# Patient Record
Sex: Male | Born: 1955 | Race: Black or African American | Hispanic: No | Smoking: Never smoker
Health system: Southern US, Community
[De-identification: ages and names within clinical notes are randomized; demographics above are authoritative.]

## PROBLEM LIST (undated history)

## (undated) DIAGNOSIS — N400 Enlarged prostate without lower urinary tract symptoms: Secondary | ICD-10-CM

## (undated) DIAGNOSIS — B009 Herpesviral infection, unspecified: Secondary | ICD-10-CM

## (undated) HISTORY — DX: Herpesviral infection, unspecified: B00.9

## (undated) HISTORY — PX: PLANTAR FASCIA RELEASE: SHX2239

## (undated) HISTORY — PX: CARPAL TUNNEL RELEASE: SHX101

## (undated) HISTORY — PX: ANKLE SURGERY: SHX546

## (undated) HISTORY — PX: OTHER SURGICAL HISTORY: SHX169

## (undated) HISTORY — DX: Benign prostatic hyperplasia without lower urinary tract symptoms: N40.0

## (undated) HISTORY — PX: KNEE SURGERY: SHX244

---

## 2006-11-04 HISTORY — PX: BACK SURGERY: SHX140

## 2007-04-01 ENCOUNTER — Inpatient Hospital Stay (HOSPITAL_COMMUNITY): Admission: RE | Admit: 2007-04-01 | Discharge: 2007-04-06 | Payer: Self-pay | Admitting: Neurosurgery

## 2007-08-14 ENCOUNTER — Encounter: Payer: Self-pay | Admitting: Family Medicine

## 2007-08-17 LAB — HM COLONOSCOPY

## 2007-12-06 HISTORY — PX: ELBOW SURGERY: SHX618

## 2008-06-16 ENCOUNTER — Ambulatory Visit: Payer: Self-pay | Admitting: Family Medicine

## 2008-06-16 DIAGNOSIS — R350 Frequency of micturition: Secondary | ICD-10-CM

## 2008-06-16 DIAGNOSIS — R0989 Other specified symptoms and signs involving the circulatory and respiratory systems: Secondary | ICD-10-CM

## 2008-06-16 DIAGNOSIS — R0609 Other forms of dyspnea: Secondary | ICD-10-CM

## 2008-06-16 LAB — CONVERTED CEMR LAB
Blood in Urine, dipstick: NEGATIVE
Protein, U semiquant: 30
Specific Gravity, Urine: >=1.03
Urobilinogen, UA: 1
pH: 5.5

## 2008-07-21 ENCOUNTER — Telehealth: Payer: Self-pay | Admitting: Family Medicine

## 2008-09-14 ENCOUNTER — Encounter: Payer: Self-pay | Admitting: Family Medicine

## 2009-10-16 ENCOUNTER — Ambulatory Visit: Payer: Self-pay | Admitting: Family Medicine

## 2009-10-16 DIAGNOSIS — R51 Headache: Secondary | ICD-10-CM

## 2009-10-16 DIAGNOSIS — R519 Headache, unspecified: Secondary | ICD-10-CM | POA: Insufficient documentation

## 2009-10-17 ENCOUNTER — Telehealth: Payer: Self-pay | Admitting: Family Medicine

## 2009-10-17 LAB — CONVERTED CEMR LAB
ALT: 26 units/L (ref 0–53)
AST: 21 units/L (ref 0–37)
Alkaline Phosphatase: 75 units/L (ref 39–117)
Creatinine, Ser: 1.35 mg/dL (ref 0.40–1.50)
HCT: 48.9 % (ref 39.0–52.0)
MCHC: 33.1 g/dL (ref 30.0–36.0)
MCV: 81.9 fL (ref 78.0–100.0)
Platelets: 197 10*3/uL (ref 150–400)
RDW: 14.2 % (ref 11.5–15.5)
Sodium: 143 meq/L (ref 135–145)
TSH: 2.841 microintl units/mL (ref 0.350–4.500)
Total Bilirubin: 0.8 mg/dL (ref 0.3–1.2)
Total Protein: 7.4 g/dL (ref 6.0–8.3)
WBC: 7.7 10*3/uL (ref 4.0–10.5)

## 2009-10-18 ENCOUNTER — Telehealth: Payer: Self-pay | Admitting: Family Medicine

## 2010-01-11 ENCOUNTER — Ambulatory Visit: Payer: Self-pay | Admitting: Family Medicine

## 2010-01-11 DIAGNOSIS — M51369 Other intervertebral disc degeneration, lumbar region without mention of lumbar back pain or lower extremity pain: Secondary | ICD-10-CM | POA: Insufficient documentation

## 2010-01-11 DIAGNOSIS — N400 Enlarged prostate without lower urinary tract symptoms: Secondary | ICD-10-CM

## 2010-01-11 DIAGNOSIS — M5136 Other intervertebral disc degeneration, lumbar region: Secondary | ICD-10-CM

## 2010-01-11 HISTORY — DX: Benign prostatic hyperplasia without lower urinary tract symptoms: N40.0

## 2010-01-12 LAB — CONVERTED CEMR LAB
ALT: 23 units/L (ref 0–53)
CO2: 21 meq/L (ref 19–32)
Calcium: 9.4 mg/dL (ref 8.4–10.5)
Chloride: 107 meq/L (ref 96–112)
Cholesterol: 172 mg/dL (ref 0–200)
Creatinine, Ser: 1.2 mg/dL (ref 0.40–1.50)
Glucose, Bld: 109 mg/dL — ABNORMAL HIGH (ref 70–99)
PSA: 0.29 ng/mL (ref 0.10–4.00)
Total Bilirubin: 0.7 mg/dL (ref 0.3–1.2)
Total CHOL/HDL Ratio: 5.5
Triglycerides: 126 mg/dL (ref <150)
VLDL: 25 mg/dL (ref 0–40)

## 2010-01-22 ENCOUNTER — Encounter: Payer: Self-pay | Admitting: Family Medicine

## 2010-01-26 ENCOUNTER — Telehealth: Payer: Self-pay | Admitting: Family Medicine

## 2010-01-31 ENCOUNTER — Encounter (INDEPENDENT_AMBULATORY_CARE_PROVIDER_SITE_OTHER): Payer: Self-pay | Admitting: *Deleted

## 2010-06-11 ENCOUNTER — Encounter: Payer: Self-pay | Admitting: Family Medicine

## 2010-09-18 ENCOUNTER — Telehealth: Payer: Self-pay | Admitting: Family Medicine

## 2010-12-05 NOTE — Assessment & Plan Note (Signed)
Summary: CPE AND LABS   Vital Signs:  Patient profile:   55 year old male Height:      73.5 inches Weight:      254 pounds Pulse rate:   75 / minute BP sitting:   139 / 81  (left arm) Cuff size:   large  Vitals Entered By: Kathlene November (January 11, 2010 1:56 PM) CC: CPE and labs Is Patient Diabetic? No   Primary Care Provider:  Nani Gasser MD  CC:  CPE and labs.  History of Present Illness: Right ear feels constantly itching.  Intially the avodart was working but now feels like it is not working.  Now the frequent urination is waking him up. Will void 4-5 times within a few hours.    Mid back tightness and soreness. Started a couple of months ago.  STarted his weight lifting program around that time. Now has been doing some stretches before his workout but this really hasn't helped.  Feels it s not coming from his back/spine.  Hands have been more painful and stiff. Comes and goes. Not time relationship to AM or daytime. Feels he has good flexibilty.      Current Medications (verified): 1)  Avodart 0.5 Mg  Caps (Dutasteride) .... Take 1 Tablet By Mouth Once A Day 2)  Hydrocortisone-Acetic Acid 1-2 % Soln (Hydrocortisone-Acetic Acid) .... 4 Gtt in Affected Ear Two Times A Day For 1 Week.  Allergies (verified): No Known Drug Allergies  Comments:  Nurse/Medical Assistant: The patient's medications and allergies were reviewed with the patient and were updated in the Medication and Allergy Lists. Kathlene November (January 11, 2010 1:57 PM)  Past History:  Past Surgical History: Last updated: 06/16/2008 Right rotator cuffsurgery.  Back surgery 2008 x 2 Elbow srugery 12/2007  Family History: Last updated: 10/16/2009 Mother with alchoholism GM with Cancer.  Father with DM, stroke, back problems, thyroid problems.   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   Married to [employer] with 4 kids.  Never Smoked Alcohol use-no Drug  use-no Regular exercise-yes  Physical Exam  General:  Well-developed,well-nourished,in no acute distress; alert,appropriate and cooperative throughout examination Head:  Normocephalic and atraumatic without obvious abnormalities. No apparent alopecia or balding. Eyes:  No corneal or conjunctival inflammation noted. EOMI. Perrla.  Ears:  External ear exam shows no significant lesions or deformities.  Otoscopic examination reveals clear canals, tympanic membranes are intact bilaterally without bulging, retraction, inflammation or discharge. Hearing is grossly normal bilaterally. Nose:  External nasal examination shows no deformity or inflammation. Nasal mucosa are pink and moist without lesions or exudates. Mouth:  Oral mucosa and oropharynx without lesions or exudates.  Teeth in good repair. Neck:  No deformities, masses, or tenderness noted. Chest Wall:  No deformities, masses, tenderness or gynecomastia noted. Lungs:  Normal respiratory effort, chest expands symmetrically. Lungs are clear to auscultation, no crackles or wheezes. Heart:  Normal rate and regular rhythm. S1 and S2 normal without gallop, murmur, click, rub or other extra sounds. Abdomen:  Bowel sounds positive,abdomen soft and non-tender without masses, organomegaly or hernias noted. Rectal:  No external abnormalities noted. Normal sphincter tone. No rectal masses or tenderness. Prostate:  no nodules, no asymmetry, and 2+ enlarged.   Msk:  No deformity or scoliosis noted of thoracic or lumbar spine.  NROM of the lumbar spine. He he actually has good flexibility of the hamstrings.  Pulses:  R and L carotid,radial,dorsalis pedis and posterior tibial pulses are  full and equal bilaterally Extremities:  No clubbing, cyanosis, edema, or deformity noted with normal full range of motion of all joints.   Neurologic:  No cranial nerve deficits noted. Station and gait are normal.DTRs are symmetrical throughout. Sensory, motor and coordinative  functions appear intact. Skin:  no rashes.   Cervical Nodes:  No lymphadenopathy noted Psych:  Cognition and judgment appear intact. Alert and cooperative with normal attention span and concentration. No apparent delusions, illusions, hallucinations   Impression & Recommendations:  Problem # 1:  HEALTH MAINTENANCE EXAM (ICD-V70.0) Contiue with regular exercise.  Encouraged weight loss. Discussed keeping a dairy for one week fo daily caloric intake and cutting calories but 200 a day to help him lose weight. D Dur for screening labs.  Given Tdap today.  Colonoscopy is up todate.  Still having chronic right ear itching even thought exam is nromal. Says it is driving him crazy so will refer to ENT for further treatment.   Orders: T-Comprehensive Metabolic Panel [id number]) T-Lipid Profile [id number]) T-TSH [id number]) T-PSA ([id number])  Problem # 2:  HYPERTROPHY PROSTATE W/O UR OBST & OTH LUTS (ICD-600.00) Still having sxs on the avodart.  AUA score is 20 which is severe.  QOL score is 5 (unhappy) so recommend adding flomax to the avodart. If sxs not improving in the next 2-3 weeks then call and will refer to Urology.    Problem # 3:  BACK PAIN (ICD-724.5) Given H.O. on back stretches. If not getting better over th next couple of weeks then please let me know.   Complete Medication List: 1)  Avodart 0.5 Mg Caps (Dutasteride) .... Take 1 tablet by mouth once a day 2)  Hydrocortisone-acetic Acid 1-2 % Soln (Hydrocortisone-acetic acid) .... 4 gtt in affected ear two times a day for 1 week. 3)  Flomax 0.4 Mg Caps (Tamsulosin hcl) .... Take 1 tablet by mouth once a day  Other Orders: Tdap => 26yrs IM ([id number]) Admin 1st Vaccine ([id number]) ENT Referral (ENT) Prescriptions: FLOMAX 0.4 MG CAPS (TAMSULOSIN HCL) Take 1 tablet by mouth once a day  #30 x 1   Entered and Authorized by:   Nani Gasser MD   Signed by:   Nani Gasser MD on 01/11/2010   Method used:   [facility]  [facility]        [facility] [phone][facility] (retail)       [address] [place], [place]  [address]       Ph: [phone] or [phone]       Fax: [phone]   RxID:   [id number]   Flex Sig Next Due:  Not Indicated Colonoscopy Result Date:  11/05/2007 Hemoccult Next Due:  Not Indicated   Immunizations Administered:  Tetanus Vaccine:    Vaccine Type: Tdap    Site: left deltoid    Mfr: GlaxoSmithKline    Dose: 0.5 ml    Route: IM    Given by: Kathlene November    Exp. Date: 01/27/2012    Lot #: [id number]    VIS given: 09/22/07 version given January 11, 2010.

## 2010-12-05 NOTE — Letter (Signed)
Summary: AUA Questionnaire/[place] [facility]  AUA Questionnaire/[place] [facility]   Imported By: Lanelle Bal 01/18/2010 12:14:41  _____________________________________________________________________  External Attachment:    Type:   Image     Comment:   External Document

## 2010-12-05 NOTE — Progress Notes (Signed)
Summary: returning call  Phone Note Call from Patient   Summary of Call: Received call from pt yesterday requesting a callback. Returned call this a.m. and left message for pt. to call back.  Mervin Kung CMA  January 26, 2010 9:26 AM   Left message for pt. to return call. Mervin Kung CMA  January 29, 2010 8:48 AM   Follow-up for Phone Call        given med for urinary frequency at last OV and wondering if it has any sexual side effects. Sex drive has decreased Follow-up by: Kathlene November,  January 29, 2010 3:18 PM  Additional Follow-up for Phone Call Additional follow up Details #1::        It is not commmon but it can cause dec libido or abnormal ejaculation. Can stop med and we will refer to Urology for furher treatment options. Please print referrall order.  Additional Follow-up by: Nani Gasser MD,  January 29, 2010 8:39 PM    Additional Follow-up for Phone Call Additional follow up Details #2::    Pt notified. Referral sent. Follow-up by: Kathlene November,  January 30, 2010 8:16 AM

## 2010-12-05 NOTE — Consult Note (Signed)
Summary: [facility]  [facility]   Imported By: Lanelle Bal 02/01/2010 11:06:49  _____________________________________________________________________  External Attachment:    Type:   Image     Comment:   External Document

## 2010-12-05 NOTE — Progress Notes (Signed)
  Phone Note From Pharmacy   [facility]  [place] [phone][facility] Call For: Dr. Linford Arnold  Summary of Call: wants a rx for fluticasone Initial call taken by: Avon Gully CMA, Duncan Dull),  September 18, 2010 2:02 PM    New/Updated Medications: FLUTICASONE PROPIONATE 50 MCG/ACT SUSP (FLUTICASONE PROPIONATE) 1-2 sprays each nostril once a day Prescriptions: FLUTICASONE PROPIONATE 50 MCG/ACT SUSP (FLUTICASONE PROPIONATE) 1-2 sprays each nostril once a day  #30 x 1   Entered by:   Avon Gully CMA, (AAMA)   Authorized by:   Nani Gasser MD   Signed by:   Avon Gully CMA, (AAMA) on 09/18/2010   Method used:   Electronically to        [facility] [phone]* (retail)       [address] [place], [place]  [address]       Ph: [phone] or [phone]       Fax: [phone]   RxID:   [id number]

## 2010-12-05 NOTE — Letter (Signed)
Summary: Primary Care Consult Scheduled Letter  [place] [facility]  [address]   [place], [place] [address]   Phone: [phone]  Fax: [phone]      01/31/2010 MRN: [id number]  [facility] [address] [place], [place]  [address]    Dear Mr. [facility],      We have scheduled an appointment for you.  At the recommendation of Dr.METHENEY, we have scheduled you a consult with [facility] ,[place] , DR Benancio Deeds  on APRIL 20,2011 at 10:17m_.  Their address is_[address], [address], [place] . The office phone number is [phone].  If this appointment day and time is not convenient for you, please feel free to call the office of the doctor you are being referred to at the number listed above and reschedule the appointment.     It is important for you to keep your scheduled appointments. We are here to make sure you are given good patient care. If you have questions or you have made changes to your appointment, please notify us at  [phone], ask for HELEN.    Thank you, HELEN Patient Care Coordinator [place]

## 2011-02-15 ENCOUNTER — Encounter: Payer: Self-pay | Admitting: Family Medicine

## 2011-02-18 ENCOUNTER — Ambulatory Visit (INDEPENDENT_AMBULATORY_CARE_PROVIDER_SITE_OTHER): Payer: BLUE CROSS/BLUE SHIELD | Admitting: Family Medicine

## 2011-02-18 ENCOUNTER — Encounter: Payer: Self-pay | Admitting: Family Medicine

## 2011-02-18 VITALS — BP 132/89 | HR 79 | Ht 72.0 in | Wt 244.0 lb

## 2011-02-18 DIAGNOSIS — Z0000 Encounter for general adult medical examination without abnormal findings: Secondary | ICD-10-CM

## 2011-02-18 DIAGNOSIS — R6882 Decreased libido: Secondary | ICD-10-CM

## 2011-02-18 DIAGNOSIS — N529 Male erectile dysfunction, unspecified: Secondary | ICD-10-CM

## 2011-02-18 DIAGNOSIS — N400 Enlarged prostate without lower urinary tract symptoms: Secondary | ICD-10-CM

## 2011-02-18 MED ORDER — SILDENAFIL CITRATE 100 MG PO TABS: 100.0000 mg | ORAL_TABLET | ORAL | Status: DC | PRN

## 2011-02-18 MED ORDER — DUTASTERIDE 0.5 MG PO CAPS: 0.5000 mg | ORAL_CAPSULE | Freq: Every day | ORAL | Status: DC

## 2011-02-18 NOTE — Progress Notes (Signed)
  Subjective:    Patient ID: Steve Maxwell, male    DOB: May 04, 1956, 55 y.o.   MRN: [id number]  HPI Here for CPE today. Has questions about his dec libido. He saw Urology last year and tried several meds for his prostate that he felt was causing ED. They also tested his testosterone that was low and he was then referred to endocrinology. He was taking a work out supplement and they asked him to stop it and then recheck his levels and they were normal. He is now off off the BPH meds and still having libido problems. He did try cialis but says it made him sick for almost 3 days. Hasn't tried any of the other meds.  He is still having frequent urination at night that is disturbing his sleep.    Review of Systems     Objective:   Physical Exam  Constitutional: He is oriented to person, place, and time. He appears well-developed and well-nourished.  HENT:  Head: Normocephalic and atraumatic.  Right Ear: External ear normal.  Left Ear: External ear normal.  Nose: Nose normal.  Mouth/Throat: Oropharynx is clear and moist.  Eyes: Conjunctivae and EOM are normal. Pupils are equal, round, and reactive to light.  Neck: Normal range of motion. Neck supple.  Cardiovascular: Normal rate, regular rhythm, normal heart sounds and intact distal pulses.        No carotid or abdominal bruits.   Pulmonary/Chest: Effort normal and breath sounds normal.  Abdominal: Soft. Bowel sounds are normal.  Genitourinary: Rectum normal. Guaiac negative stool.  Musculoskeletal: Normal range of motion. He exhibits no edema.       Muscular build.   Neurological: He is alert and oriented to person, place, and time. He has normal reflexes.  Skin: Skin is warm.  Psychiatric: He has a normal mood and affect.          Assessment & Plan:  CPE- Normal exam today except for enlarge prostate. I do thin he needs to work on his weight. He really needs to lose about 20 lbs.  He works out regularly  ED - Trial of viagra.  Didn't do well with cialis

## 2011-02-18 NOTE — Assessment & Plan Note (Signed)
Restart Advodart.  AUA sx score of 11, but rates his QOL of 5 (unhappy). Follow up in 6 wks and repeat AUA score.  ED score of 11 today.  Test questionnaire + for 3 questions.

## 2011-02-18 NOTE — Patient Instructions (Signed)
Your goal weight is under 190.  We will call you with your lab results once you go to the lab fasting ( 8 hours).

## 2011-02-18 NOTE — Assessment & Plan Note (Signed)
ED score of 11 today.  Test questionnaire + for 3 questions.  .  Discussed that there are many factors that can contribute to Ed including rest, diet, weight, and relationship with his wife.

## 2011-02-19 LAB — LIPID PANEL
Cholesterol: 150 mg/dL (ref 0–200)
HDL: 30 mg/dL — ABNORMAL LOW (ref >39)
LDL Cholesterol: 97 mg/dL (ref 0–99)
Total CHOL/HDL Ratio: 5 Ratio
Triglycerides: 115 mg/dL (ref <150)
VLDL: 23 mg/dL (ref 0–40)

## 2011-02-19 LAB — PSA: PSA: 0.46 ng/mL (ref <=4.00)

## 2011-02-20 ENCOUNTER — Telehealth: Payer: Self-pay | Admitting: Family Medicine

## 2011-02-20 LAB — TESTOSTERONE, FREE, TOTAL, SHBG
Sex Hormone Binding: 26 nmol/L (ref 13–71)
Testosterone, Free: 56.5 pg/mL (ref 47.0–244.0)
Testosterone-% Free: 2.2 % (ref 1.6–2.9)
Testosterone: 255.37 ng/dL (ref 250–890)

## 2011-02-20 LAB — COMPLETE METABOLIC PANEL WITH GFR
ALT: 23 U/L (ref 0–53)
CO2: 28 mEq/L (ref 19–32)
Calcium: 9.5 mg/dL (ref 8.4–10.5)
Chloride: 109 mEq/L (ref 96–112)
Creat: 1.46 mg/dL (ref 0.40–1.50)
GFR, Est African American: 60 mL/min — ABNORMAL LOW (ref >60)
GFR, Est Non African American: 50 mL/min — ABNORMAL LOW (ref >60)
Glucose, Bld: 127 mg/dL — ABNORMAL HIGH (ref 70–99)
Sodium: 144 mEq/L (ref 135–145)
Total Protein: 6.6 g/dL (ref 6.0–8.3)

## 2011-02-20 NOTE — Telephone Encounter (Signed)
Call pt: testosterone on the low end of normal. If he would like to try tesosteronet replacement we can.  His kidney function is up a little. Is he taking any muscle building products. If so then stop these and lets recheck the kidney funciton in one month. Sugar in the diabetic range. Make app tiwht me din one month to check and make sure he doesn't have Diabetes. Cholesterol is OK except the good cholesterol is low. Aerobic exercise will improve this. Prostate funtiton is up a little from last time but still in the normal range. Recheck in 6 months.

## 2011-02-21 NOTE — Telephone Encounter (Signed)
Called and left message to call back in re labs 

## 2011-02-21 NOTE — Telephone Encounter (Signed)
Pt notified and will f/u in one month

## 2011-02-28 ENCOUNTER — Telehealth: Payer: Self-pay | Admitting: *Deleted

## 2011-02-28 NOTE — Telephone Encounter (Signed)
[facility] called to check status of prior auth for pt's Avodart.  Form was faxed on 4/24 and 4/26.  [employer] will need to be called for PA.  800 # not given by Myline as she states it is on the paperwork she has faxed.

## 2011-03-01 ENCOUNTER — Telehealth: Payer: Self-pay | Admitting: *Deleted

## 2011-03-01 MED ORDER — VALACYCLOVIR HCL 1 G PO TABS: 2000.0000 mg | ORAL_TABLET | Freq: Two times a day (BID) | ORAL | Status: DC

## 2011-03-01 MED ORDER — TESTOSTERONE 20.25 MG/ACT (1.62%) TD GEL: 2.0000 | Freq: Every day | TRANSDERMAL | Status: DC

## 2011-03-01 NOTE — Telephone Encounter (Signed)
Will send over the gel. Can go online to androgel and print out a coupon.  Also sent over med for fever blisters. If wants can take OTC niacin and that will help.

## 2011-03-01 NOTE — Telephone Encounter (Signed)
Pt.notified

## 2011-03-01 NOTE — Telephone Encounter (Signed)
Pt wants to try testos therapy and wants to do the gel under arms. Pt also wants a rx sent to his pharm for fever blisters.Pt also wants to know what else he can do for his chol as far excersise if he already spends an hour on the treadmill.

## 2011-03-19 NOTE — Op Note (Signed)
Steve Maxwell, Maxwell NO.:  [id number]   MEDICAL RECORD NO.:  [id number]          PATIENT TYPE:  INP   LOCATION:  3008                         FACILITY:  [facility]   PHYSICIAN:  Cristi Loron, M.D.DATE OF BIRTH:  November 28, 1955   DATE OF PROCEDURE:  04/01/2007  DATE OF DISCHARGE:                               OPERATIVE REPORT   BRIEF HISTORY:  The patient is a 55 year old black male who has suffered  from back and left greater than right leg pain.  He failed medical  management and was worked up with a lumbar MRI which demonstrated the  patient had degenerative disc disease and foraminal stenosis at L5-S1.  I discussed the various treatment options with the patient including  surgery.  The patient weighed the risks, benefits and alternatives of  surgery and decided to proceed with a L5-S1 decompression and fusion.   PREOPERATIVE DIAGNOSIS:  L5-S1 degenerative disc disease, spondylosis,  spinal stenosis, lumbar radiculopathy, lumbago.   POSTOPERATIVE DIAGNOSIS:  L5-S1 degenerative disc disease, spondylosis, spinal stenosis, lumbar  radiculopathy, lumbago.   PROCEDURE:  Bilateral laminotomies and foraminotomies at L5; L5-S1  posterior lumbar interbody fusion; insertion of L5-S1 interbody  prosthesis (Capstone PEEK cages), L5-S1 posterior nonsegmental  instrumentation with Legacy titanium pedicle screws and rods; L5-S1  posterolateral arthrodesis with local morselized autograft bone and  VITOSS bone graft extender.   SURGEON:  Cristi Loron, M.D.   ASSISTANT:  Danae Orleans. Venetia Maxon, M.D.   ANESTHESIA:  General endotracheal anesthesia.   ESTIMATED BLOOD LOSS:  300 mL.   SPECIMENS:  None.   DRAINS:  None.   COMPLICATIONS:  None.   DESCRIPTION OF PROCEDURE:  The patient was brought to the operating room  by the anesthesia team and general endotracheal anesthesia was induced.  The patient was turned to the prone position on the Wilson frame.  His  lumbosacral region was then prepared with Betadine scrub and Betadine  solution.  Sterile drapes were applied.  I then injected the area to be  incised with Marcaine with epinephrine solution.  I used the scalpel to  make a linear midline incision over the L5-S1 interspace. I used  electrocautery to perform a bilateral subperiosteal dissection exposing  the spinous process and lamina of L5 and the upper sacrum.  We obtained  an intraoperative radiograph to confirm our location.  We then inserted  the Versatrac retractor for exposure.   We began on the left side. We used a high speed drill to perform a L5  laminotomy.  Because the patient had foraminal stenosis, we did a  greater decompression than was required to perform the lumbar interbody  fusion, i.e., we performed foraminotomies about the left L5 and S1 nerve  roots.  In order to get decompression of the exiting left L5 nerve root,  i.e., the symptomatic side, I removed the inferior facet of L5 and we  performed a wide foraminotomy about the left L5 nerve root.  We then  repeated this procedure in analogous fashion on the right side, although  we did not need to remove the inferior facet of L5,  but we did perform a  generous foraminotomy about the right L L5-S1 nerve root, as well.   Having completed the decompression, we now turned attention to the  posterior lumbar fusion.  We incised the L5-S1 intravertebral disc  bilaterally and performed aggressive bilateral discectomy using  pituitary forceps and the Epstein and Scoville curets.  We then prepared  the vertebral endplates for the fusion using the curets, removing all  the soft tissue.  We then used trial spacers and determined to use a 10  by 26 mm Capstone PEEK cage bilaterally.  We prefilled these cages with  local morselized autograft bone we obtained during the decompression as  well as VITOSS bone graft extender.  We filled lateral and medial to the  disc space, as well,  with VITOSS and local bone.  We inserted the  prosthesis into the interspace, of course after retracting the nerve  roots out of harm's way, reconfirmed the position using fluoroscopy.   Having completed the posterior lumbar interbody fusion and placement of  the interbody prosthesis, we now turned attention to the  instrumentation. Under fluoroscopic guidance, we cannulated the  bilateral L5 and S1 pedicles using a bone probe.  We tapped the L5  pedicles with a 5.5, the S1 pedicles with a 6.5 mm tap.  We inserted a  6.5 by 50 mm pedicle screws bilaterally at L5 and 7.5 x 35 and 40 mm  pedicle screws at S1, again under fluoroscopic guidance.  We then  palpated along the medial aspect of the L5-S1 nerve roots and noted  there was no cortical breeches and the L5-S1 nerve roots were not  injured.  On AP fluoroscopy, the right L5 pedicle screw looked like it  might have been somewhat lateral.  We removed the screw and then probed  inside the screw path and noted that the path was well within the bone,  we replaced a screw and we then connected the unilateral pedicle screws  with a lordotic rod we fastened in place with the caps which we  tightened appropriately completing the instrumentation.   We then turned attention to the posterolateral arthrodesis.  We  decorticated the remainder of the right pars, lamina and the L5-S1 facet  as well as the transverse processes on the left at L5-S1, the sacral ala  and S1.  We then placed a combination of the VITOSS and local bone over  these decorticated posterolateral structures completing the  posterolateral arthrodesis.   We then irrigated the wound out with bacitracin solution.  We inspected  the dura and the L5and S1 nerve roots bilaterally noting that they were  well decompressed.  We obtained hemostasis using bipolar electrocautery.  We then removed the retractor and then reapproximated the patient's thoracolumbar fascia with interrupted #1  Vicryl suture, subcutaneous  tissues with interrupted 2-0 Vicryl suture, and the skin with Steri-  Strips and Benzoin.  The wound was then coated with bacitracin ointment,  a sterile dressing was applied, the drapes were removed.  The patient  was subsequently returned to the supine position where he was extubated  by the anesthesia team and transported to the post anesthesia care unit  in stable condition.  All sponge, instrument and needle counts were  correct at the end of the case.      Cristi Loron, M.D.  Electronically Signed     JDJ/MEDQ  D:  04/01/2007  T:  04/01/2007  Job:  [id number]

## 2011-03-20 ENCOUNTER — Encounter: Payer: Self-pay | Admitting: Family Medicine

## 2011-03-20 ENCOUNTER — Telehealth: Payer: Self-pay | Admitting: Family Medicine

## 2011-03-20 DIAGNOSIS — E291 Testicular hypofunction: Secondary | ICD-10-CM | POA: Insufficient documentation

## 2011-03-20 NOTE — Telephone Encounter (Signed)
Called for prior auth on this patient for his androgel 1.62% gel pump.  Rec prior auth and confirmation will be sent to our office and the pharm today.  [facility] inform[facility]  Also told pharmacist that pt belongs to a non brand name plan with his insurance therefore the only drug on list that is generic is finasteride and I'll have to get an approval on that medication from Dr. Linford Arnold before calling script.  Insurance will not approve the avodart since pt participates in non brand name program with insurance. Plan:  Routed to Dr. Marlyne Beards, LPN Dom[facility] Dimes

## 2011-03-21 MED ORDER — FINASTERIDE 5 MG PO TABS: 5.0000 mg | ORAL_TABLET | Freq: Every day | ORAL | Status: DC

## 2011-03-21 NOTE — Telephone Encounter (Signed)
OK, sent over [email or web address] Thank you for working on this.

## 2011-03-22 NOTE — Discharge Summary (Signed)
NAMEGUNNARD, Steve Maxwell NO.:  [id number]   MEDICAL RECORD NO.:  [id number]          PATIENT TYPE:  INP   LOCATION:  3008                         FACILITY:  [facility]   PHYSICIAN:  Cristi Loron, M.D.DATE OF BIRTH:  Oct 25, 1956   DATE OF ADMISSION:  04/01/2007  DATE OF DISCHARGE:  04/06/2007                               DISCHARGE SUMMARY   For further details of this admission, please refer to typed history and  physical.   BRIEF HISTORY:  The patient is a 55 year old black male who has suffered  from back and left greater than right leg pain.  He has failed medical  management, was worked up with a lumbar MRI, which demonstrated that the  patient had degenerative disk disease and a foraminal stenosis at L5-S1.  I discussed the various treatment options with the patient, including  surgery.  The patient has weighed the risks, benefits, and alternatives  of surgery and has decided to proceed with an L5-S1 decompression and  fusion.   For further details of this admission, please refer to typed history and  physical.   HOSPITAL COURSE:  I admitted the patient at the [facility] on  Apr 01, 2007.  On admission, I performed an L5 decompression,  instrumentation, and fusion.  The surgery went well.  For full details  of this operation, please refer to the typed operative note.   POSTOPERATIVE COURSE:  The patient's postoperative course was  unremarkable except that he had urinary retention.  This persisted.  The  patient was seen by the urologist, and the urinary retention resolved by  April 06, 2007.   At this point the patient was requesting to be discharged home and was  discharged home on April 06, 2007.   DISCHARGE INSTRUCTIONS:  The patient is instructed to follow up with me  in a month.  He was instructed to follow up with the urologist as per  his instructions.   FINAL DIAGNOSIS:  L5-S1 degenerative disease, spondylosis, spinal  stenosis, lumbar  radiculopathy, lumbago.   PROCEDURES PERFORMED:  1. Bilateral laminotomies and foraminotomies of L5.  2. L5-S1 posterior lumbar interbody fusion.  3. Insertion at L5-S1 interbody prosthesis (PEEK cages).  4. L5-S1 posterior nonsegmental instrumentation with Legacy titanium      pedicle screws and rods.  5. L5-S1 posterolateral arthrodesis with local morselized autograft      bone and VITOSS bone graft extender.      Cristi Loron, M.D.  Electronically Signed     JDJ/MEDQ  D:  04/30/2007  T:  05/01/2007  Job:  [id number]

## 2011-05-10 ENCOUNTER — Telehealth: Payer: Self-pay | Admitting: Family Medicine

## 2011-05-10 NOTE — Telephone Encounter (Signed)
Pt called asking questions about taking protein shakes and said the provider had told him to stop, but he didn't think he was over doing it. Plan:  When reviewing last office note it made mention on ED and enlarged prostrate and he was told to lay off the work out supplements.  Told pt to call back Monday and sched a 6 week follow up from April 2012 which he had not done and he could ask add'l questions to the provider about the protein shakes. Steve Newcomer, LPN [facility]

## 2011-06-07 ENCOUNTER — Telehealth: Payer: Self-pay | Admitting: Family Medicine

## 2011-06-07 MED ORDER — IBUPROFEN 800 MG PO TABS: 800.0000 mg | ORAL_TABLET | Freq: Three times a day (TID) | ORAL | Status: DC | PRN

## 2011-06-07 NOTE — Telephone Encounter (Signed)
rx sent

## 2011-06-07 NOTE — Telephone Encounter (Signed)
Pt called and stated he had back surgery 4 yrs ago, and obviously he is having pain and complaining of pain and soreness.  Rates pain #9/10.  Pt is requesting a prescription for ibuprofen 800 mg for this.  States this works well for him when he has these flares. Please advise. Jarvis Newcomer, LPN [facility]

## 2011-06-07 NOTE — Telephone Encounter (Signed)
Pt notified and had to [facility] that his ibuprofen script had been sent to his pharm. Jarvis Newcomer, LPN [facility]

## 2011-07-05 ENCOUNTER — Encounter: Payer: Self-pay | Admitting: Family Medicine

## 2011-07-05 ENCOUNTER — Ambulatory Visit (INDEPENDENT_AMBULATORY_CARE_PROVIDER_SITE_OTHER): Payer: BLUE CROSS/BLUE SHIELD | Admitting: Family Medicine

## 2011-07-05 DIAGNOSIS — N400 Enlarged prostate without lower urinary tract symptoms: Secondary | ICD-10-CM

## 2011-07-05 DIAGNOSIS — M542 Cervicalgia: Secondary | ICD-10-CM

## 2011-07-05 DIAGNOSIS — E291 Testicular hypofunction: Secondary | ICD-10-CM

## 2011-07-05 MED ORDER — DUTASTERIDE 0.5 MG PO CAPS: 0.5000 mg | ORAL_CAPSULE | Freq: Every day | ORAL | Status: DC

## 2011-07-05 NOTE — Progress Notes (Signed)
  Subjective:    Patient ID: Steve Maxwell, male    DOB: July 23, 1956, 55 y.o.   MRN: [id number]  HPI BPH - Worse on the finasteride. Felt the avodart worked much beter. He was able to sleep through the night on the vaodart but now having to get up about 4-5 times at night to urinate.  He would like to go back to Avodart if possible. It was initially changed to finasteride because of his insurance plan.  Having pain around his shoulders and neck. Even bought a new pillow. Says has a lot of tension.  Feels really sore.  Hasn't been working out in [employer].  No trauma. He has been very stressed.   No numbness or tingling in the hands. Being at work seems to make it worse. He does note that he has not been exercising recently. He has been under a lot more stress recently even outside of work.  Hypogonadism-he never filled the prescription for the AndroGel because it was going to be so expensive. He wanted to talk further about the injections.  Review of Systems     Objective:   Physical Exam  Constitutional: He is oriented to person, place, and time. He appears well-developed and well-nourished.  Cardiovascular: Normal rate, regular rhythm and normal heart sounds.   Pulmonary/Chest: Effort normal and breath sounds normal.  Musculoskeletal:       Neck with normal flexion, extension, rotation right and left. Pain with side bending to the RT, better to the LT. Non tender over the cerivicl spine or the paraspinous muscles. Shoulders with NROM.   Neurological: He is alert and oriented to person, place, and time.  Skin: Skin is warm.  Psychiatric: He has a normal mood and affect. His behavior is normal.          Assessment & Plan:  Acute neck pain - LIkley more msk.  Recommend gentle stretches. Work on [employer] and tension. If not better can get an cervical xray to eval for arthritis. Consider physical therapy as well. Also work on tension at work.  BPH - discussed will change back to avodart and  should hopefully now be covered with his insurance.   Hypogonadism-discussed with him the option of starting the testosterone injections. We will also see if we have a coupon carpeting in her shell that might make it more for that. He will call Monday no heat sides. I explained that sometimes treating the testosterone can help with mood, energy level, muscle strength. That certainly we do not have to treat it. He also has complaints of low libido and testosterone replacement therapy could help with this.

## 2011-07-05 NOTE — Patient Instructions (Signed)
Can look online for coupon for the androgel too! If not consider the injections.

## 2011-08-13 ENCOUNTER — Telehealth: Payer: Self-pay | Admitting: *Deleted

## 2011-08-13 DIAGNOSIS — R351 Nocturia: Secondary | ICD-10-CM

## 2011-08-13 NOTE — Telephone Encounter (Signed)
Left message on vm

## 2011-08-13 NOTE — Telephone Encounter (Signed)
Pt called and states the avodart is no longer working for him and he has to get up 4-5 times during the night. Pt also states he want to start back working out and wants to know what type of protein shakes would be best

## 2011-08-13 NOTE — Telephone Encounter (Signed)
If Avodart not working as well then lets check UA and recheck a PSA (last was in April).  I don't really have any suggestions for protein shakes.  Just make sure don't take more than the can recommends.

## 2011-10-03 ENCOUNTER — Other Ambulatory Visit: Payer: Self-pay | Admitting: *Deleted

## 2011-10-03 MED ORDER — DUTASTERIDE 0.5 MG PO CAPS: 0.5000 mg | ORAL_CAPSULE | Freq: Every day | ORAL | Status: DC

## 2011-10-04 ENCOUNTER — Other Ambulatory Visit: Payer: Self-pay | Admitting: *Deleted

## 2011-10-04 MED ORDER — DUTASTERIDE 0.5 MG PO CAPS: 0.5000 mg | ORAL_CAPSULE | Freq: Every day | ORAL | Status: DC

## 2011-10-07 ENCOUNTER — Other Ambulatory Visit: Payer: Self-pay | Admitting: *Deleted

## 2011-10-15 ENCOUNTER — Other Ambulatory Visit: Payer: Self-pay | Admitting: *Deleted

## 2011-10-15 MED ORDER — DUTASTERIDE 0.5 MG PO CAPS: 0.5000 mg | ORAL_CAPSULE | Freq: Every day | ORAL | Status: DC

## 2011-10-17 ENCOUNTER — Other Ambulatory Visit: Payer: Self-pay | Admitting: *Deleted

## 2011-10-17 MED ORDER — VALACYCLOVIR HCL 1 G PO TABS: 2000.0000 mg | ORAL_TABLET | Freq: Two times a day (BID) | ORAL | Status: DC

## 2011-12-12 ENCOUNTER — Encounter: Payer: Self-pay | Admitting: Family Medicine

## 2011-12-12 ENCOUNTER — Ambulatory Visit (INDEPENDENT_AMBULATORY_CARE_PROVIDER_SITE_OTHER): Payer: 59 | Admitting: Family Medicine

## 2011-12-12 VITALS — BP 138/95 | HR 76 | Wt 251.0 lb

## 2011-12-12 DIAGNOSIS — E291 Testicular hypofunction: Secondary | ICD-10-CM

## 2011-12-12 DIAGNOSIS — N400 Enlarged prostate without lower urinary tract symptoms: Secondary | ICD-10-CM

## 2011-12-12 DIAGNOSIS — E669 Obesity, unspecified: Secondary | ICD-10-CM | POA: Insufficient documentation

## 2011-12-12 DIAGNOSIS — N529 Male erectile dysfunction, unspecified: Secondary | ICD-10-CM

## 2011-12-12 MED ORDER — TESTOSTERONE 20.25 MG/ACT (1.62%) TD GEL: 2.0000 | Freq: Every day | TRANSDERMAL | Status: DC

## 2011-12-12 MED ORDER — DOXAZOSIN MESYLATE 2 MG PO TABS: 2.0000 mg | ORAL_TABLET | Freq: Every day | ORAL | Status: DC

## 2011-12-12 MED ORDER — CYCLOBENZAPRINE HCL 10 MG PO TABS: 10.0000 mg | ORAL_TABLET | Freq: Every evening | ORAL | Status: DC | PRN

## 2011-12-12 NOTE — Progress Notes (Signed)
  Subjective:    Patient ID: Steve Maxwell, male    DOB: 07-11-1956, 56 y.o.   MRN: [id number]  HPI BPH- Avodart is doing well but still sometimes have more sxs. Thought forgot to take it today and has had to urinate 10 times today already.  Obesity-he is very frustrated because he's been working out sometimes even twice a day without any good results. He still having difficulty losing the weight and says he eats very healthy. He recently started increase the protein in his diet about a week ago.  ED - Headaches on viagra and cialis.  He is now seperated from his wife for the lat 5-6 months.    Neck pain-he is still having a lot of neck pain. He is not interested in physical therapy at this point. He says he has tried muscle relaxers in the past and didn't find them helpful and would like to try this again. His pain is mostly in the upper trapezius area.  Review of Systems     Objective:   Physical Exam  Constitutional: He is oriented to person, place, and time. He appears well-developed and well-nourished.  HENT:  Head: Normocephalic and atraumatic.  Neurological: He is alert and oriented to person, place, and time.  Skin: Skin is warm and dry.  Psychiatric: He has a normal mood and affect. His behavior is normal.          Assessment & Plan:  BPH- Will add doxazosin to his Avodart. We also discussed the option of adding low-dose daily Cialis to control his BPH. It is possible he may tolerate a low-dose Cialis without headaches compared to the higher dose medication. Followup in 8 weeks. Repeat an AUA symptom score at that point in time.  ED- he gets headaches with Viagra and Cialis. I really encouraged him to follow back up with urology. He says that he is already discussed other options with the urologist including a pump injections. It is difficult to compare with these. He says it also makes him not want to go out and date because of his ED.  Hypogonadism-again we discussed  options. I demonstrated the AndroGel and Axiron for him. He was most interested in the Andro gel. We also discussed possibly injection stating that his insurance coverage. He would like to start with the Androgel and use a coupon card first. If that does not move through the we will try the Axiron, and if that is denied with his insurance and we will try the biweekly injections. We discussed that this may actually help with losing weight across his abdomen in addition to muscle strength while working out. He may even slightly improved his ED but we will have to wait and see on that.  Neck pain-we will try a Zestril at bedtime. Warned about potential sedation and to be careful with the medication.

## 2011-12-16 ENCOUNTER — Ambulatory Visit: Payer: BLUE CROSS/BLUE SHIELD | Admitting: Family Medicine

## 2012-01-24 ENCOUNTER — Other Ambulatory Visit: Payer: Self-pay | Admitting: *Deleted

## 2012-01-24 MED ORDER — DUTASTERIDE 0.5 MG PO CAPS: 0.5000 mg | ORAL_CAPSULE | Freq: Every day | ORAL | Status: DC

## 2012-01-24 MED ORDER — DUTASTERIDE 0.5 MG PO CAPS: 0.5000 mg | ORAL_CAPSULE | Freq: Every day | ORAL | Status: AC

## 2012-01-24 NOTE — Telephone Encounter (Signed)
[facility] so wants a new rx [facility]art [facility] [place]

## 2012-02-07 ENCOUNTER — Encounter: Payer: Self-pay | Admitting: *Deleted

## 2012-02-10 ENCOUNTER — Telehealth: Payer: Self-pay | Admitting: Family Medicine

## 2012-02-10 ENCOUNTER — Ambulatory Visit (INDEPENDENT_AMBULATORY_CARE_PROVIDER_SITE_OTHER): Payer: 59 | Admitting: Family Medicine

## 2012-02-10 ENCOUNTER — Encounter: Payer: Self-pay | Admitting: Family Medicine

## 2012-02-10 VITALS — BP 131/88 | HR 97 | Ht 72.0 in | Wt 243.0 lb

## 2012-02-10 DIAGNOSIS — E291 Testicular hypofunction: Secondary | ICD-10-CM

## 2012-02-10 DIAGNOSIS — K921 Melena: Secondary | ICD-10-CM

## 2012-02-10 DIAGNOSIS — N400 Enlarged prostate without lower urinary tract symptoms: Secondary | ICD-10-CM

## 2012-02-10 LAB — HEPATIC FUNCTION PANEL
ALT: 23 U/L (ref 0–53)
AST: 22 U/L (ref 0–37)
Alkaline Phosphatase: 84 U/L (ref 39–117)
Bilirubin, Direct: 0.2 mg/dL (ref 0.0–0.3)
Indirect Bilirubin: 0.4 mg/dL (ref 0.0–0.9)
Total Bilirubin: 0.6 mg/dL (ref 0.3–1.2)

## 2012-02-10 MED ORDER — DOXAZOSIN MESYLATE 2 MG PO TABS: 2.0000 mg | ORAL_TABLET | Freq: Every day | ORAL | Status: DC

## 2012-02-10 MED ORDER — DOXAZOSIN MESYLATE 4 MG PO TABS: 4.0000 mg | ORAL_TABLET | Freq: Every day | ORAL | Status: AC

## 2012-02-10 NOTE — Progress Notes (Signed)
  Subjective:    Patient ID: Steve Maxwell, male    DOB: 02/17/1956, 56 y.o.   MRN: [id number]  HPI Hypogonadism - Says hasn't really noticed an improved in his sex drive. He is still going through a seperation.   BPH-he does think his symptoms are much improved.  They're exacerbated he drinks more fluid but otherwise he feels his urinary symptoms are under much better control. He has tolerated the addition of doxazosin to artery well. He denies any side effects or dizziness.  Had some blood in the stool after started taking IBU for back pain. Now that has stopped the IBU it has gone away. Says last colonsocpy was less than 5 years ago. He denies any constipation or straining with bowel movements. He denies any change in bowel movements. No family history of colon cancer.  Review of Systems     Objective:   Physical Exam  Constitutional: He appears well-developed and well-nourished.  Skin: Skin is warm and dry.  Psychiatric: He has a normal mood and affect. His behavior is normal.          Assessment & Plan:  Hypogonadism - we will check his testosterone levels today. To see physical. We can adjust AndroGel as needed. He's been on a great weeks at this point. Recheck liver enzymes as well as PSA. Followup in 4 months.  BPH - AUA  Score 11, down from 20 a year ago.  I think he is doing really well on his current regimen. We will go ahead and increase his doxazosin to 4 mg a day. Call if he has any symptoms or dizziness. Otherwise followup in 4 months. We also discussed getting a consult with urology. He says he would be interested in surgery possibly if that made he was able to come off of medications. I explained to him that there are other side effects such as permanent erectile dysfunction that can be a side effect of prostate surgery. He says he will think about the consult. I would be happy to set him up anytime he would like. We will call for colonoscopy report.   Blood in the  stool-it has resolved since he stopped ibuprofen. His colonoscopy is up-to-date we will call for the formal report. If this recurs especially off of NSAIDs then please call me immediately and we will schedule him for colonoscopy. For now recommend avoid NSAIDs if at all possible.

## 2012-02-10 NOTE — Telephone Encounter (Signed)
Call [facility] and get his colonsoscopy report. I think from 2009.

## 2012-02-11 ENCOUNTER — Other Ambulatory Visit: Payer: Self-pay | Admitting: *Deleted

## 2012-02-11 MED ORDER — TESTOSTERONE 20.25 MG/ACT (1.62%) TD GEL: 2.0000 | Freq: Every day | TRANSDERMAL | Status: DC

## 2012-02-11 NOTE — Telephone Encounter (Signed)
Called [facility] and pt had never been seen there before. Called [facility]- never been seen. Called [facility] and had done there so office will fax colonoscopy report. KJ LPN

## 2012-02-13 ENCOUNTER — Encounter: Payer: Self-pay | Admitting: *Deleted

## 2012-06-24 ENCOUNTER — Other Ambulatory Visit: Payer: Self-pay | Admitting: *Deleted

## 2012-06-24 MED ORDER — VALACYCLOVIR HCL 1 G PO TABS: 2000.0000 mg | ORAL_TABLET | Freq: Two times a day (BID) | ORAL | Status: DC

## 2012-08-06 ENCOUNTER — Telehealth: Payer: Self-pay | Admitting: *Deleted

## 2012-08-06 MED ORDER — VALACYCLOVIR HCL 1 G PO TABS: 2000.0000 mg | ORAL_TABLET | Freq: Two times a day (BID) | ORAL | Status: DC

## 2012-08-06 NOTE — Telephone Encounter (Signed)
Wants a refill for Valtrex but removed from his rx.

## 2012-08-06 NOTE — Telephone Encounter (Signed)
Let pt Know refill send to pharm.

## 2012-08-06 NOTE — Telephone Encounter (Signed)
Left message for patient on rx called to pharm

## 2012-08-11 ENCOUNTER — Other Ambulatory Visit: Payer: Self-pay | Admitting: Family Medicine

## 2012-09-01 ENCOUNTER — Other Ambulatory Visit: Payer: Self-pay | Admitting: *Deleted

## 2012-09-01 MED ORDER — IBUPROFEN 800 MG PO TABS: 800.0000 mg | ORAL_TABLET | Freq: Three times a day (TID) | ORAL | Status: DC | PRN

## 2012-10-09 ENCOUNTER — Encounter: Payer: Self-pay | Admitting: Family Medicine

## 2012-10-09 ENCOUNTER — Ambulatory Visit (INDEPENDENT_AMBULATORY_CARE_PROVIDER_SITE_OTHER): Payer: 59 | Admitting: Family Medicine

## 2012-10-09 VITALS — BP 135/87 | HR 85 | Temp 98.0°F | Ht 72.0 in | Wt 252.0 lb

## 2012-10-09 DIAGNOSIS — B009 Herpesviral infection, unspecified: Secondary | ICD-10-CM

## 2012-10-09 DIAGNOSIS — M25511 Pain in right shoulder: Secondary | ICD-10-CM | POA: Insufficient documentation

## 2012-10-09 DIAGNOSIS — M25519 Pain in unspecified shoulder: Secondary | ICD-10-CM

## 2012-10-09 DIAGNOSIS — G4700 Insomnia, unspecified: Secondary | ICD-10-CM

## 2012-10-09 DIAGNOSIS — H60339 Swimmer's ear, unspecified ear: Secondary | ICD-10-CM | POA: Insufficient documentation

## 2012-10-09 DIAGNOSIS — N529 Male erectile dysfunction, unspecified: Secondary | ICD-10-CM

## 2012-10-09 HISTORY — DX: Herpesviral infection, unspecified: B00.9

## 2012-10-09 MED ORDER — HYDROCORTISONE-ACETIC ACID 1-2 % OT SOLN: 4.0000 [drp] | Freq: Four times a day (QID) | OTIC | Status: DC

## 2012-10-09 MED ORDER — VARDENAFIL HCL 20 MG PO TABS: ORAL_TABLET | ORAL | Status: DC

## 2012-10-09 MED ORDER — VALACYCLOVIR HCL 1 G PO TABS: 2000.0000 mg | ORAL_TABLET | Freq: Two times a day (BID) | ORAL | Status: DC

## 2012-10-09 NOTE — Progress Notes (Signed)
CC: Steve Maxwell is a 56 y.o. male is here for left ear pain   Subjective: HPI:  Patient presents for acute care visit for left ear pain. He describes a sudden itching and discomfort that has been present for 1-1/2 weeks. It seems to be getting worse on an every other day basis, he is here today because he is concerned it is starting to involve the periphery of the ear. There been no interventions as of yet. He denies sticking anything in the ear other than his finger. He has never had this before the pain is nonradiating. The pain is mild to moderate in severity. He denies fevers, chills, hearing loss, ear fullness, ringing of the ears, dizziness, headaches, facial pain, nasal congestion, nor sore throat  He mentions that he's had right shoulder pain is been bothering him for one month that he would like to discuss today. It seems to be getting better on a weekly basis however he's unsure what to do about it. This started after he lifted dumbbells that were too heavy, he's unsure exactly what way they were or what position he was engaged in. The pain is localized in the right anterior shoulder and is nonradiating. It feels like a cramp, it is worse when lifting dumbbells greater than 60 pounds. He denies any motor sensory disturbances in the right upper extremity. There's been no neck pain. He denies weakness or muscle atrophy in the right upper extremity. Other than that above there's been no trauma or exertion recently. No interventions as of yet other than cutting back drastically on his strength training routine.  He would like to mentions erectile dysfunction issues have been going on for a very year now. He has tried Viagra but it causes him to get a runny nose, mild headache, and a flushed feeling. He has trouble maintaining initiating erection when not using Viagra. He has never taken anything else before but is hoping to see if there is a more tolerable medication.  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 He is unsure how often he gets outbreaks. He is unsure of last outbreak that he had. He denies cold sores lesions or suspicious sores anywhere else on his body.  Is relieving the rim he would like to discuss sleeping issues. He mentions that for the past weeks to months if he goes to bed at 11 PM he gets sound sleep until 6 AM. He feels well rested in the morning. However if he falls asleep at 9 PM he will wake up at 3 in the morning with trouble getting back to sleep. He denies excessive thoughts or racing thoughts before or after awakening. He would like to know if there is a medication that is non-habit forming he can start.  He is unsure if she's continues to snore work he has any apnea as nobody sleeps with him.   Review Of Systems Outlined In HPI  Past Medical History  Diagnosis Date  . 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 HYPERTROPHY PROSTATE W/O UR OBST & OTH LUTS 01/11/2010    Qualifier: Diagnosis of  By: Linford Arnold MD, [facility] History  Problem Relation Age of Onset  . Alcohol abuse Mother   . Diabetes Father   . Stroke Father   . Thyroid disease Father   . Other Father     back problems  . Cancer Other  History  Substance Use Topics  . Smoking status: Never Smoker   . Smokeless tobacco: Not on file  . Alcohol Use: No     Objective: Filed Vitals:   10/09/12 1409  BP: 135/87  Pulse: 85  Temp: 98 F (36.7 C)    General: Alert and Oriented, No Acute Distress HEENT: Pupils equal, round, reactive to light. Conjunctivae clear.  External ears unremarkable, canals clear with intact TMs with appropriate landmarks.  Left external ear canal is mildly edematous with moderate erythema . Middle ear appears open without effusion. Pink inferior turbinates.  Moist mucous membranes, pharynx without inflammation nor lesions.  Neck supple without palpable lymphadenopathy nor  abnormal masses. Lungs: Clear to auscultation bilaterally, no wheezing/ronchi/rales.  Comfortable work of breathing. Good air movement. Cardiac: Regular rate and rhythm. Normal S1/S2.  No murmurs, rubs, nor gallops.   Right shoulder: Rotator cuff testing reveals full-strength for all 4 muscle groups, full range of motion and strength in the right shoulder, Hawkins negative, Neer's negative, crossarm/scarf test negative, O'Brien's negative however this position does cause some discomfort over the coracoid process . Extremities: No peripheral edema.  Strong peripheral pulses.  Mental Status: No depression, anxiety, nor agitation. Skin: Warm and dry.  Assessment & Plan: Beckem was seen today for left ear pain.  Diagnoses and associated orders for this visit:  Swimmer's ear - acetic acid-hydrocortisone (VOSOL-HC) otic solution; Place 4 drops into the left ear 4 (four) times daily.  Right shoulder pain  Hsv (herpes simplex virus) infection - valACYclovir (VALTREX) 1000 MG tablet; Take 2 tablets (2,000 mg total) by mouth 2 (two) times daily.  Ed (erectile dysfunction)  Insomnia  Other Orders - Discontinue: sildenafil (VIAGRA) 100 MG tablet; Take 100 mg by mouth as needed. - vardenafil (LEVITRA) 20 MG tablet; Half to full tab 60 min prior to activity.    Swimmer's ear, otitis externa on the left: Start acetic acid hydrocortisone 4 times a day for 10 days Right shoulder pain: Encouraged patient to slowly ease back in to his strength training routine however focus on low weights and high reps and progress slowly on a weekly basis HSV: Stable, No current outbreak refilled Valtrex to use an as-needed basis Erectile dysfunction: Not improved due to side effects of Viagra, patient specifically asking for Cialis samples which we do not have in clinic today however he will try Levitra samples Insomnia: Start melatonin 5 mg however return at your convenience if not improved to get more time on  this particular complaint.   Return if symptoms worsen or fail to improve.

## 2012-10-13 ENCOUNTER — Telehealth: Payer: Self-pay | Admitting: *Deleted

## 2012-10-13 MED ORDER — NEOMYCIN-POLYMYXIN-HC 3.5-10000-1 OT SOLN: 4.0000 [drp] | Freq: Three times a day (TID) | OTIC | Status: AC

## 2012-10-13 NOTE — Telephone Encounter (Signed)
Sue Lush, Will you please let Mr. Baltimore know that I sent in a stronger ear drop Rx [facility] on [facility].  If not improved in 48 hours please schedule appointment for re-evaluation.

## 2012-10-13 NOTE — Telephone Encounter (Signed)
Pt calls back and states using the ear drops 4 times a day as instructed and left ear is no better, causing left side headache and tenderness, can not hardly hear out of it, ear is throbbing. Said if calls something else in to please send to [facility] in [place]

## 2012-10-13 NOTE — Telephone Encounter (Signed)
Pt.notified

## 2012-12-04 ENCOUNTER — Ambulatory Visit (INDEPENDENT_AMBULATORY_CARE_PROVIDER_SITE_OTHER): Payer: 59 | Admitting: Sports Medicine

## 2012-12-04 ENCOUNTER — Ambulatory Visit (INDEPENDENT_AMBULATORY_CARE_PROVIDER_SITE_OTHER): Payer: 59

## 2012-12-04 DIAGNOSIS — M1711 Unilateral primary osteoarthritis, right knee: Secondary | ICD-10-CM

## 2012-12-04 DIAGNOSIS — M1710 Unilateral primary osteoarthritis, unspecified knee: Secondary | ICD-10-CM

## 2012-12-04 DIAGNOSIS — IMO0002 Reserved for concepts with insufficient information to code with codable children: Secondary | ICD-10-CM

## 2012-12-04 DIAGNOSIS — M170 Bilateral primary osteoarthritis of knee: Secondary | ICD-10-CM | POA: Insufficient documentation

## 2012-12-04 DIAGNOSIS — M19049 Primary osteoarthritis, unspecified hand: Secondary | ICD-10-CM | POA: Insufficient documentation

## 2012-12-04 MED ORDER — MELOXICAM 15 MG PO TABS: ORAL_TABLET | ORAL | Status: DC

## 2012-12-04 NOTE — Assessment & Plan Note (Signed)
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 X-rays. Guided injection as above. Knee brace. Home rehabilitation exercises. Mobic. Return in 4 weeks

## 2012-12-04 NOTE — Assessment & Plan Note (Signed)
Mobic.  X-rays. Return to see me in 4 weeks, no better we can consider injection into the distal interphalangeal joint.

## 2012-12-04 NOTE — Progress Notes (Signed)
  Subjective:    CC: Right knee pain  HPI: Right knee pain: Steve Maxwell is a very pleasant 57 year old male status post multiple knee surgeries in the past, he's had persistent pain for years that he localizes in the anterolateral aspect of his right knee worse when going up and down stairs. He has significant amount of swelling recently after he accidentally twisted his knee. He has had injections and aspirations in the past and desires this again today. He denies any mechanical symptoms, pain is localized without radiation, and he is really not tried much in terms of conservative oral analgesics.  Bilateral hand pain: Predominately at the distal interphalangeal joints. Pain is localized, doesn't radiate, is moderate. Has been present for years.  Past medical history, Surgical history, Family history not pertinant except as noted below, Social history, Allergies, and medications have been entered into the medical record, reviewed, and no changes needed.   Review of Systems: No headache, visual changes, nausea, vomiting, diarrhea, constipation, dizziness, abdominal pain, skin rash, fevers, chills, night sweats, weight loss, swollen lymph nodes, body aches, joint swelling, muscle aches, chest pain, shortness of breath, mood changes, visual or auditory hallucinations.   Objective:   General: Well Developed, well nourished, and in no acute distress.  Neuro/Psych: Alert and oriented x3, extra-ocular muscles intact, able to move all 4 extremities, sensation grossly intact. Skin: Warm and dry, no rashes noted.  Respiratory: Not using accessory muscles, speaking in full sentences, trachea midline.  Cardiovascular: Pulses palpable, no extremity edema. Abdomen: Does not appear distended. Right Knee: Mild effusion noted. Tender to palpation under lateral patellar facet. ROM full in flexion and extension and lower leg rotation. Ligaments with solid consistent endpoints including ACL, PCL, LCL,  MCL. Negative Mcmurray's, Apley's, and Thessalonian tests. Non painful patellar compression. Patellar glide with significant crepitus. Patellar and quadriceps tendons unremarkable. Hamstring and quadriceps strength is normal.  Hand: Bilateral Heberden's and Bouchard nodes present.  Procedure: Real-time Ultrasound Guided Injection of right knee Device: GE Logiq E  Ultrasound guided injection is preferred based studies that show increased duration, increased effect, greater accuracy, decreased procedural pain, increased response rate, and decreased cost with ultrasound guided versus blind injection.  Verbal informed consent obtained.  Time-out conducted.  Noted no overlying erythema, induration, or other signs of local infection.  Skin prepped in a sterile fashion.  Local anesthesia: Topical Ethyl chloride.  With sterile technique and under real time ultrasound guidance:  2 cc Kenalog 40, 4 cc lidocaine injected easily into the suprapatellar recess. Completed without difficulty  Pain immediately resolved suggesting accurate placement of the medication.  Advised to call if fevers/chills, erythema, induration, drainage, or persistent bleeding.  Images permanently stored and available for review in the ultrasound unit.  Impression: Technically successful ultrasound guided injection.  Impression and Recommendations:   This case required medical decision making of moderate complexity.

## 2012-12-22 ENCOUNTER — Ambulatory Visit (INDEPENDENT_AMBULATORY_CARE_PROVIDER_SITE_OTHER): Payer: 59 | Admitting: Family Medicine

## 2012-12-22 ENCOUNTER — Encounter: Payer: Self-pay | Admitting: Family Medicine

## 2012-12-22 ENCOUNTER — Telehealth: Payer: Self-pay | Admitting: *Deleted

## 2012-12-22 VITALS — BP 137/90 | HR 80 | Temp 97.6°F | Wt 233.0 lb

## 2012-12-22 DIAGNOSIS — H60399 Other infective otitis externa, unspecified ear: Secondary | ICD-10-CM

## 2012-12-22 DIAGNOSIS — H6090 Unspecified otitis externa, unspecified ear: Secondary | ICD-10-CM

## 2012-12-22 DIAGNOSIS — J209 Acute bronchitis, unspecified: Secondary | ICD-10-CM

## 2012-12-22 MED ORDER — CIPROFLOXACIN-HYDROCORTISONE 0.2-1 % OT SUSP: 4.0000 [drp] | Freq: Two times a day (BID) | OTIC | Status: DC

## 2012-12-22 MED ORDER — NEOMYCIN-POLYMYXIN-HC 3.5-10000-1 OT SOLN: 4.0000 [drp] | Freq: Three times a day (TID) | OTIC | Status: AC

## 2012-12-22 MED ORDER — AZITHROMYCIN 250 MG PO TABS: ORAL_TABLET | ORAL | Status: AC

## 2012-12-22 MED ORDER — IBUPROFEN 400 MG PO TABS: 400.0000 mg | ORAL_TABLET | Freq: Four times a day (QID) | ORAL | Status: DC | PRN

## 2012-12-22 NOTE — Telephone Encounter (Signed)
Pharmacy calls and states that the Cipro HC drops are 100.00 for this patient and needs something cheaper sent to them. [facility] in [place]

## 2012-12-22 NOTE — Telephone Encounter (Signed)
Substitution has been sent in.

## 2012-12-22 NOTE — Progress Notes (Signed)
CC: Steve Maxwell is a 57 y.o. male is here for Sore Throat and Nasal Congestion   Subjective: HPI:  Patient claims of a cough. This is been present since the middle of last week. Is described as productive. It is described as moderate in severity. Nothing seems to make it better or worse he is tried Alka-Seltzer without improvement. Symptoms do not interfere with sleep. They are bad enough to where he does not want to work out. He had some subjective fevers and chills this morning which prompted today's visit. It is associated with a sensation of congestion in the chest. He denies shortness of breath, chest pain, exertional chest pain, orthopnea, peripheral edema, confusion or lightheadedness. Has been associated with a sore throat on the left since this weekend. It is sore only with swallowing not with movement of the neck. No sick contacts   Review Of Systems Outlined In HPI  Past Medical History  Diagnosis Date  . 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 HYPERTROPHY PROSTATE W/O UR OBST & OTH LUTS 01/11/2010    Qualifier: Diagnosis of  By: Linford Arnold MD, [place] Mask History  Problem Relation Age of Onset  . Alcohol abuse Mother   . Diabetes Father   . Stroke Father   . Thyroid disease Father   . Other Father     back problems  . Cancer Other      History  Substance Use Topics  . Smoking status: Never Smoker   . Smokeless tobacco: Not on file  . Alcohol Use: No     Objective: Filed Vitals:   12/22/12 0858  BP: 137/90  Pulse: 80  Temp: 97.6 F (36.4 C)    General: Alert and Oriented, No Acute Distress HEENT: Pupils equal, round, reactive to light. Conjunctivae clear.  Moderate erythema and of the left external canal without edema, canals clear with intact TMs with appropriate landmarks.  Middle ear appears open without effusion. Pink inferior turbinates.  Moist mucous membranes, pharynx  With mild left tonsillar erythema and without lesions.  Neck  supple without palpable lymphadenopathy nor abnormal masses. Lungs: Comfortable work of breathing, moderate rhonchi in all lung fields without wheezing nor rales. No sign of consolidation Cardiac: Regular rate and rhythm. Normal S1/S2.  No murmurs, rubs, nor gallops.    Extremities: No peripheral edema.  Strong peripheral pulses.  Mental Status: No depression, anxiety, nor agitation. Skin: Warm and dry.  Assessment & Plan: Steve Maxwell was seen today for sore throat and nasal congestion.  Diagnoses and associated orders for this visit:  Otitis externa - ciprofloxacin-hydrocortisone (CIPRO HC) otic suspension; Place 4 drops into the left ear 2 (two) times daily. For seven days. - ibuprofen (ADVIL,MOTRIN) 400 MG tablet; Take 1 tablet (400 mg total) by mouth every 6 (six) hours as needed for pain.  Acute bronchitis - azithromycin (ZITHROMAX) 250 MG tablet; Take two tabs at once on day 1, then one tab daily on days 2-5.    Acute bronchitis: Start azithromycin, continue Alka-Seltzer products consider adding guaifenesin Otitis externa: Initially prescribed Cipro HC however $100 at his pharmacy, will send Cortisporin instead  Return if symptoms worsen or fail to improve.

## 2012-12-29 ENCOUNTER — Telehealth: Payer: Self-pay | Admitting: *Deleted

## 2012-12-29 DIAGNOSIS — J329 Chronic sinusitis, unspecified: Secondary | ICD-10-CM

## 2012-12-29 MED ORDER — AMOXICILLIN-POT CLAVULANATE 500-125 MG PO TABS: ORAL_TABLET | ORAL | Status: AC

## 2012-12-29 NOTE — Telephone Encounter (Signed)
Pt called and states he still feeling bad and sinuses still feel clogged. Pt would like another abx called in

## 2013-01-01 ENCOUNTER — Ambulatory Visit: Payer: 59 | Admitting: Sports Medicine

## 2013-01-08 ENCOUNTER — Ambulatory Visit: Payer: 59 | Admitting: Sports Medicine

## 2013-02-22 ENCOUNTER — Ambulatory Visit (INDEPENDENT_AMBULATORY_CARE_PROVIDER_SITE_OTHER): Payer: 59 | Admitting: Sports Medicine

## 2013-02-22 ENCOUNTER — Ambulatory Visit (INDEPENDENT_AMBULATORY_CARE_PROVIDER_SITE_OTHER): Payer: 59 | Admitting: Physician Assistant

## 2013-02-22 ENCOUNTER — Ambulatory Visit: Payer: 59 | Admitting: Family Medicine

## 2013-02-22 ENCOUNTER — Encounter: Payer: Self-pay | Admitting: Physician Assistant

## 2013-02-22 VITALS — BP 129/87 | HR 83 | Wt 226.0 lb

## 2013-02-22 DIAGNOSIS — H6982 Other specified disorders of Eustachian tube, left ear: Secondary | ICD-10-CM

## 2013-02-22 DIAGNOSIS — H6120 Impacted cerumen, unspecified ear: Secondary | ICD-10-CM

## 2013-02-22 DIAGNOSIS — M1711 Unilateral primary osteoarthritis, right knee: Secondary | ICD-10-CM

## 2013-02-22 DIAGNOSIS — H6121 Impacted cerumen, right ear: Secondary | ICD-10-CM

## 2013-02-22 DIAGNOSIS — M1710 Unilateral primary osteoarthritis, unspecified knee: Secondary | ICD-10-CM

## 2013-02-22 DIAGNOSIS — IMO0002 Reserved for concepts with insufficient information to code with codable children: Secondary | ICD-10-CM

## 2013-02-22 DIAGNOSIS — H6980 Other specified disorders of Eustachian tube, unspecified ear: Secondary | ICD-10-CM

## 2013-02-22 MED ORDER — FLUTICASONE PROPIONATE 50 MCG/ACT NA SUSP: 2.0000 | Freq: Every day | NASAL | Status: DC

## 2013-02-22 MED ORDER — METHYLPREDNISOLONE 4 MG PO KIT: PACK | ORAL | Status: DC

## 2013-02-22 NOTE — Progress Notes (Signed)
  Subjective:    Patient ID: Steve Maxwell, male    DOB: [birth year], 57 y.o.   MRN: [id number]  HPI Patient presents to the clinic with left ear pain for 1 week. He was dx with right ear infection about 2 weeks ago. He was given amoxicillin and antibiotic drops. Right ear pain resolve but then left started. He went ahead and started drops in left ear. He took all 10 days of abx. Left ear pain feels more like pressure and congested. No sinus pressure, ST, cough, SOB, wheezing, Headache, n/v/d.     Review of Systems     Objective:   Physical Exam  Constitutional: He is oriented to person, place, and time. He appears well-developed and well-nourished.  HENT:  Head: Normocephalic and atraumatic.  Right Ear: External ear normal.  Left Ear: External ear normal.  Nose: Nose normal.  Mouth/Throat: Oropharynx is clear and moist. No oropharyngeal exudate.  TM's appear a little dry and left a little retracted. Good light reflex. No signs of blood or pus.   Right ear cerumen impacted. Nurse irrigated then TM's clear.   Eyes: Conjunctivae are normal.  Neck: Normal range of motion. Neck supple.  Cardiovascular: Normal rate, regular rhythm and normal heart sounds.   Pulmonary/Chest: Effort normal and breath sounds normal. He has no wheezes.  Lymphadenopathy:    He has no cervical adenopathy.  Neurological: He is alert and oriented to person, place, and time.  Skin: Skin is warm and dry.  Psychiatric: He has a normal mood and affect. His behavior is normal.          Assessment & Plan:  Left eustachian dysfunction/right ear cerumen impaction- Started nasal spray daily. Gave medrol dose pak to help decrease inflammation. Discussed daily zyrtec/claritin during the spring months. Right ear irrigated by nurse.

## 2013-02-22 NOTE — Patient Instructions (Addendum)
Start nasal steroid 2 sprays each nostril daily.

## 2013-02-22 NOTE — Assessment & Plan Note (Signed)
Almost 3 months of response to initial injection. Repeated today. Return in 4 weeks, can consider Visco supplementation if no better.

## 2013-02-22 NOTE — Progress Notes (Signed)
   Subjective:    CC:  Follow up.  HPI: Right knee osteoarthritis:  I saw Steve Maxwell approximately 3 months ago, injected his right knee. He had about a month and a half to 2 months of benefit, but unfortunately the pain has returned. He localizes the pain predominantly the medial joint line he notes it's worse with ambulation and weightbearing. Pain does not radiate, moderate, stable.  Past medical history, Surgical history, Family history not pertinant except as noted below, Social history, Allergies, and medications have been entered into the medical record, reviewed, and no changes needed.   Review of Systems: No headache, visual changes, nausea, vomiting, diarrhea, constipation, dizziness, abdominal pain, skin rash, fevers, chills, night sweats, weight loss, swollen lymph nodes, body aches, joint swelling, muscle aches, chest pain, shortness of breath, mood changes, visual or auditory hallucinations.   Objective:   General: Well Developed, well nourished, and in no acute distress.  Neuro/Psych: Alert and oriented x3, extra-ocular muscles intact, able to move all 4 extremities, sensation grossly intact. Skin: Warm and dry, no rashes noted.  Respiratory: Not using accessory muscles, speaking in full sentences, trachea midline.  Cardiovascular: Pulses palpable, no extremity edema. Abdomen: Does not appear distended. Right Knee: No effusion, mild medial joint line pain. ROM full in flexion and extension and lower leg rotation. Ligaments with solid consistent endpoints including ACL, PCL, LCL, MCL. Negative Mcmurray's, Apley's, and Thessalonian tests. Non painful patellar compression. Patellar glide without crepitus. Patellar and quadriceps tendons unremarkable. Hamstring and quadriceps strength is normal.   Procedure: Real-time Ultrasound Guided Injection of right knee Device: GE Logiq E  Ultrasound guided injection is preferred based studies that show increased duration, increased  effect, greater accuracy, decreased procedural pain, increased response rate, and decreased cost with ultrasound guided versus blind injection.  Verbal informed consent obtained.  Time-out conducted.  Noted no overlying erythema, induration, or other signs of local infection.  Skin prepped in a sterile fashion.  Local anesthesia: Topical Ethyl chloride.  With sterile technique and under real time ultrasound guidance:  2 cc Kenalog 40, 4 cc lidocaine injected easily into the suprapatellar recess. Completed without difficulty  Pain immediately resolved suggesting accurate placement of the medication.  Advised to call if fevers/chills, erythema, induration, drainage, or persistent bleeding.  Images permanently stored and available for review in the ultrasound unit.  Impression: Technically successful ultrasound guided injection. Impression and Recommendations:   This case required medical decision making of moderate complexity.

## 2013-03-03 ENCOUNTER — Telehealth: Payer: Self-pay | Admitting: *Deleted

## 2013-03-03 NOTE — Telephone Encounter (Signed)
Called pt back and asked had he been taking either the zyrtec or claritin and using the flonase? He stated that he had finished the medrol and had only taken the flonase. I asked him why had he not tried taking the other allergy meds that Lesly Rubenstein had suggested and he stated that he didn't know that she had told him anything about those. I read back to him what she had suggested to him at the OV and told him to go to the pharmacy once he got off work to p/u either one of the two meds and try those along with the flonase. I told him that if he is NOT feeling any better by Monday or if his sxs become worse he should come in to be seen or if it is after hours to go either to the ED or UC. Pt voiced understanding and agreed to plan.Loralee Pacas [place]

## 2013-03-03 NOTE — Telephone Encounter (Signed)
Called pt to bc he called stating that the meds that were given to him at his last OV worked for the first 3 days and now are no longer working. I lvm telling him that according to his d/c instructions he was to f/u if his sxs failed to improve or got worse.Steve Maxwell [place]

## 2013-03-23 ENCOUNTER — Ambulatory Visit (INDEPENDENT_AMBULATORY_CARE_PROVIDER_SITE_OTHER): Payer: 59 | Admitting: Sports Medicine

## 2013-03-23 ENCOUNTER — Encounter: Payer: Self-pay | Admitting: Family Medicine

## 2013-03-23 ENCOUNTER — Encounter: Payer: Self-pay | Admitting: Sports Medicine

## 2013-03-23 ENCOUNTER — Other Ambulatory Visit: Payer: Self-pay | Admitting: Family Medicine

## 2013-03-23 ENCOUNTER — Ambulatory Visit (INDEPENDENT_AMBULATORY_CARE_PROVIDER_SITE_OTHER): Payer: 59 | Admitting: Family Medicine

## 2013-03-23 VITALS — BP 135/84 | HR 77 | Temp 98.2°F | Wt 230.0 lb

## 2013-03-23 VITALS — BP 135/84 | HR 77 | Wt 230.0 lb

## 2013-03-23 DIAGNOSIS — IMO0002 Reserved for concepts with insufficient information to code with codable children: Secondary | ICD-10-CM

## 2013-03-23 DIAGNOSIS — M1711 Unilateral primary osteoarthritis, right knee: Secondary | ICD-10-CM

## 2013-03-23 DIAGNOSIS — M1710 Unilateral primary osteoarthritis, unspecified knee: Secondary | ICD-10-CM

## 2013-03-23 DIAGNOSIS — B9689 Other specified bacterial agents as the cause of diseases classified elsewhere: Secondary | ICD-10-CM

## 2013-03-23 DIAGNOSIS — H60399 Other infective otitis externa, unspecified ear: Secondary | ICD-10-CM

## 2013-03-23 DIAGNOSIS — H60392 Other infective otitis externa, left ear: Secondary | ICD-10-CM

## 2013-03-23 DIAGNOSIS — A499 Bacterial infection, unspecified: Secondary | ICD-10-CM

## 2013-03-23 DIAGNOSIS — J329 Chronic sinusitis, unspecified: Secondary | ICD-10-CM

## 2013-03-23 DIAGNOSIS — N138 Other obstructive and reflux uropathy: Secondary | ICD-10-CM

## 2013-03-23 DIAGNOSIS — N529 Male erectile dysfunction, unspecified: Secondary | ICD-10-CM

## 2013-03-23 MED ORDER — AMOXICILLIN-POT CLAVULANATE 500-125 MG PO TABS: ORAL_TABLET | ORAL | Status: AC

## 2013-03-23 MED ORDER — NEOMYCIN-POLYMYXIN-HC 3.5-10000-1 OT SOLN: 3.0000 [drp] | Freq: Three times a day (TID) | OTIC | Status: AC

## 2013-03-23 MED ORDER — KETOROLAC TROMETHAMINE 60 MG/2ML IM SOLN
60.0000 mg | Freq: Once | INTRAMUSCULAR | Status: AC
  Administered 2013-03-23: 60 mg via INTRAMUSCULAR

## 2013-03-23 MED ORDER — TADALAFIL 5 MG PO TABS: 5.0000 mg | ORAL_TABLET | Freq: Every day | ORAL | Status: DC | PRN

## 2013-03-23 MED ORDER — PREDNISONE 20 MG PO TABS: ORAL_TABLET | ORAL | Status: AC

## 2013-03-23 NOTE — Assessment & Plan Note (Signed)
Continues to be resolved after injection one month ago. He can come back to see [place] on an as-needed basis, we can consider Visco supplementation if needed.

## 2013-03-23 NOTE — Progress Notes (Signed)
  Subjective:    CC: Followup  HPI: Steve Maxwell is one-month status post injection of his right knee for osteoarthritis. He is 100% pain free.  Past medical history, Surgical history, Family history not pertinant except as noted below, Social history, Allergies, and medications have been entered into the medical record, reviewed, and no changes needed.   Review of Systems: No fevers, chills, night sweats, weight loss, chest pain, or shortness of breath.   Objective:    General: Well Developed, well nourished, and in no acute distress.  Neuro: Alert and oriented x3, extra-ocular muscles intact, sensation grossly intact.  HEENT: Normocephalic, atraumatic, pupils equal round reactive to light, neck supple, no masses, no lymphadenopathy, thyroid nonpalpable.  Skin: Warm and dry, no rashes. Cardiac: Regular rate and rhythm, no murmurs rubs or gallops, no lower extremity edema.  Respiratory: Clear to auscultation bilaterally. Not using accessory muscles, speaking in full sentences. Right Knee: Normal to inspection with no erythema or effusion or obvious bony abnormalities. Palpation normal with no warmth, joint line tenderness, patellar tenderness, or condyle tenderness. ROM full in flexion and extension and lower leg rotation. Ligaments with solid consistent endpoints including ACL, PCL, LCL, MCL. Negative Mcmurray's, Apley's, and Thessalonian tests. Non painful patellar compression. Patellar glide without crepitus. Patellar and quadriceps tendons unremarkable. Hamstring and quadriceps strength is normal.  Impression and Recommendations:

## 2013-03-23 NOTE — Progress Notes (Signed)
CC: Steve Maxwell is a 57 y.o. male is here for left ear pain, Headache and Sore Throat   Subjective: HPI:  Patient complains of left ear pain has been present for one week worsening on a daily basis moderate severity that is radiating down the left lateral neck. Worse with pressure to the ear or when touching the ear. Worse when swallowing or chewing. Slightly improved with ibuprofen nothing else makes better or worse.  Associated with left forehead pain in the front is described as pressure nothing makes better or worse. Has trouble describing the pain in his left ear other than that above. Denies fevers, chills, cough, shortness of breath, motor or sensory disturbances, dizziness, loss of hearing, nor ringing of the ears  Complains of inability to maintain erection that has been present for over 3 months. Has tried Viagra and Cialis 10 mg in the past both of which caused headaches which were intolerable. Has been prescribed AndroGel the past and reports he uses it 2-3 times a week doesn't feel it helping much. Describes low libido but no fatigue. Admits to waking to urinate 2-3 times at night but during the daytime denies urinary urgency, incomplete voiding, weak stream, nor straining to urinate.    Review Of Systems Outlined In HPI  Past Medical History  Diagnosis Date  . 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 HYPERTROPHY PROSTATE W/O UR OBST & OTH LUTS 01/11/2010    Qualifier: Diagnosis of  By: Steve Arnold MD, [place] Mask History  Problem Relation Age of Onset  . Alcohol abuse Mother   . Diabetes Father   . Stroke Father   . Thyroid disease Father   . Other Father     back problems  . Cancer Other      History  Substance Use Topics  . Smoking status: Never Smoker   . Smokeless tobacco: Not on file  . Alcohol Use: No     Objective: Filed Vitals:   03/23/13 1449  BP: 135/84  Pulse: 77  Temp: 98.2 F (36.8 C)    General: Alert and Oriented, No  Acute Distress HEENT: Pupils equal, round, reactive to light. Conjunctivae clear.  External ears unremarkable, canals clear with intact TMs with appropriate landmarks left canal has moderate erythema and and mild edema.  Middle ear appears open without effusion. Pink inferior turbinates.  Moist mucous membranes, pharynx without inflammation nor lesions.  Neck supple without palpable lymphadenopathy nor abnormal masses. Left frontal sinus tenderness to percussion Lungs: Clear to auscultation bilaterally, no wheezing/ronchi/rales.  Comfortable work of breathing. Good air movement. Cardiac: Regular rate and rhythm. Normal S1/S2.  No murmurs, rubs, nor gallops.   Extremities: No peripheral edema.  Strong peripheral pulses.  Mental Status: No depression, anxiety, nor agitation. Skin: Warm and dry.  Assessment & Plan: Steve Maxwell was seen today for left ear pain, headache and sore throat.  Diagnoses and associated orders for this visit:  Otitis, externa, infective, left - neomycin-polymyxin-hydrocortisone (CORTISPORIN) otic solution; Place 3 drops into the left ear 3 (three) times daily. Use entire bottle. - predniSONE (DELTASONE) 20 MG tablet; Three tabs daily days 1-3, two tabs daily days 4-6, one tab daily days 7-9, half tab daily days 10-13. - ketorolac (TORADOL) injection 60 mg; Inject 2 mLs (60 mg total) into the muscle once.  BPH with obstruction/lower urinary tract symptoms - tadalafil (CIALIS) 5 MG tablet; Take 1 tablet (5 mg total) by mouth daily as needed for erectile  dysfunction.  Erectile dysfunction - tadalafil (CIALIS) 5 MG tablet; Take 1 tablet (5 mg total) by mouth daily as needed for erectile dysfunction.  Bacterial sinusitis - amoxicillin-clavulanate (AUGMENTIN) 500-125 MG per tablet; Take one by mouth every 8 hours for ten total days. - ketorolac (TORADOL) injection 60 mg; Inject 2 mLs (60 mg total) into the muscle once.    Otitis externa start Cortisporin and avoid foreign  objects in the ear, for bacterial sinusitis start Augmentin. If lack of improvement in 48 hours on above regimen for either issue may start prednisone given good response on Medrol Pak in the past. Erectile dysfunction: Uncontrolled, given symptoms suggestive of BPH with a history of normal PSAs start low-dose daily Cialis sample given call if requesting formal prescription. I've asked him to use his AndroGel a daily basis for 3 months and return for symptom evaluation and testosterone level, PSA, CBC check.  Return in about 3 months (around 06/23/2013).

## 2013-03-24 ENCOUNTER — Ambulatory Visit: Payer: 59 | Admitting: Family Medicine

## 2013-04-27 ENCOUNTER — Telehealth: Payer: Self-pay | Admitting: *Deleted

## 2013-04-27 DIAGNOSIS — N529 Male erectile dysfunction, unspecified: Secondary | ICD-10-CM

## 2013-04-27 DIAGNOSIS — H9209 Otalgia, unspecified ear: Secondary | ICD-10-CM

## 2013-04-27 DIAGNOSIS — N401 Enlarged prostate with lower urinary tract symptoms: Secondary | ICD-10-CM

## 2013-04-27 MED ORDER — TADALAFIL 5 MG PO TABS: 5.0000 mg | ORAL_TABLET | Freq: Every day | ORAL | Status: DC | PRN

## 2013-04-27 NOTE — Telephone Encounter (Signed)
Pt stated that he is still having some problems with his ear and would like a referral to ENT.Loralee Pacas [place]

## 2013-04-27 NOTE — Telephone Encounter (Signed)
Referral placed.

## 2013-04-29 ENCOUNTER — Other Ambulatory Visit: Payer: Self-pay | Admitting: *Deleted

## 2013-04-29 DIAGNOSIS — N138 Other obstructive and reflux uropathy: Secondary | ICD-10-CM

## 2013-04-29 DIAGNOSIS — N529 Male erectile dysfunction, unspecified: Secondary | ICD-10-CM

## 2013-04-29 MED ORDER — TADALAFIL 5 MG PO TABS: 5.0000 mg | ORAL_TABLET | Freq: Every day | ORAL | Status: DC | PRN

## 2013-05-05 ENCOUNTER — Telehealth: Payer: Self-pay | Admitting: *Deleted

## 2013-05-05 NOTE — Telephone Encounter (Signed)
called and informed pt that the pharmacy will usually send PA for meds that are not covered. Called the phramacy  And was told that his plan only allows for 6 pills per month and his co pay is $30.00  Called pt back and told him he stated that he will contact his ins co.Steve Maxwell, Viann Shove

## 2013-06-21 ENCOUNTER — Other Ambulatory Visit: Payer: Self-pay | Admitting: *Deleted

## 2013-06-21 MED ORDER — MELOXICAM 15 MG PO TABS: ORAL_TABLET | ORAL | Status: DC

## 2013-08-20 ENCOUNTER — Telehealth: Payer: Self-pay | Admitting: *Deleted

## 2013-08-20 MED ORDER — CYCLOBENZAPRINE HCL 10 MG PO TABS: 10.0000 mg | ORAL_TABLET | Freq: Every evening | ORAL | Status: DC | PRN

## 2013-08-20 NOTE — Telephone Encounter (Signed)
Returning pt's call he stated that he can hardly stand up. He was calling to see if he can get something called in for this flexeril. rx sent to [facility].Loralee Pacas [place]

## 2013-08-23 ENCOUNTER — Encounter: Payer: Self-pay | Admitting: Family Medicine

## 2013-08-23 ENCOUNTER — Ambulatory Visit (INDEPENDENT_AMBULATORY_CARE_PROVIDER_SITE_OTHER): Payer: 59 | Admitting: Family Medicine

## 2013-08-23 VITALS — BP 142/94 | HR 78 | Wt 247.0 lb

## 2013-08-23 DIAGNOSIS — N401 Enlarged prostate with lower urinary tract symptoms: Secondary | ICD-10-CM

## 2013-08-23 DIAGNOSIS — M549 Dorsalgia, unspecified: Secondary | ICD-10-CM

## 2013-08-23 DIAGNOSIS — N138 Other obstructive and reflux uropathy: Secondary | ICD-10-CM

## 2013-08-23 MED ORDER — TADALAFIL 2.5 MG PO TABS: ORAL_TABLET | ORAL | Status: DC

## 2013-08-23 NOTE — Progress Notes (Signed)
CC: Steve Maxwell is a 57 y.o. male is here for right lower back pain   Subjective: HPI:  Patient complains of back pain that is localized to the low left back that is nonradiating it has been present ever since Saturday soon after walking on a treadmill with a steep incline for half an hour. Pain is described only as pain moderate in severity over the weekend mild today after starting on Flexeril. Also taking ibuprofen. Pain is worse with extension is not present with any forward flexion or bending. Denies saddle paresthesia, bowel or bladder incontinence, nor midline back pain. Denies recent over exertion or trauma other than above. Symptoms are present all hours a day. His biggest concern is that he may have a kidney infection, he denies dysuria darkening of the urine nor flank pain.  Patient expresses frustration with continued polyuria and waking 2-3 times a night to urinate.  He has tried Avodart, Flomax, and doxazosin all of which caused intolerable side effects. He had a fantastic response on 5 mg Cialis daily however insurance would not cover this.   Review Of Systems Outlined In HPI  Past Medical History  Diagnosis Date  . 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 HYPERTROPHY PROSTATE W/O UR OBST & OTH LUTS 01/11/2010    Qualifier: Diagnosis of  By: Linford Arnold MD, [place] Mask History  Problem Relation Age of Onset  . Alcohol abuse Mother   . Diabetes Father   . Stroke Father   . Thyroid disease Father   . Other Father     back problems  . Cancer Other      History  Substance Use Topics  . Smoking status: Never Smoker   . Smokeless tobacco: Not on file  . Alcohol Use: No     Objective: Filed Vitals:   08/23/13 1513  BP: 142/94  Pulse: 78    General: Alert and Oriented, No Acute Distress HEENT: Pupils equal, round, reactive to light. Conjunctivae clear.  Moist mucous membranes Lungs: Clear to auscultation bilaterally, no wheezing/ronchi/rales.   Comfortable work of breathing. Good air movement. Cardiac: Regular rate and rhythm. Normal S1/S2.  No murmurs, rubs, nor gallops.   Extremities: No peripheral edema.  Strong peripheral pulses. On the left straight leg raise is negative, FABER/FADIR negative, long roll negative. He has full range of motion and strength of both lower extremities with L4 and S1 DTRs two over four  Back: Stork test negative bilaterally. There is no midline tenderness in the lumbar region pain is reproduced with her spinal musculature in the left lumbar region there is no CVA tenderness. Mental Status: No depression, anxiety, nor agitation. Skin: Warm and dry.  Assessment & Plan: [name] was seen today for right lower back pain.  Diagnoses and associated orders for this visit:  BPH with obstruction/lower urinary tract symptoms - Tadalafil 2.5 MG TABS; One by mouth daily for treatment of BPH.  BACK PAIN    BPH: Uncontrolled Will resubmit prescription for Cialis daily use only under a diagnosis of BPH explicitly not for erectile dysfunction, followup PCP for further management Back pain: Urinalysis obtained without glucose nor any signs of infection reassurance provided to patient. He is satisfied with continuing on cyclobenzaprine as we expect this to improve in the next week, given handout on back strengthening to be performed at home  Return in about 3 months (around 11/23/2013).

## 2013-09-14 ENCOUNTER — Other Ambulatory Visit: Payer: Self-pay | Admitting: Family Medicine

## 2013-10-18 ENCOUNTER — Other Ambulatory Visit: Payer: Self-pay | Admitting: Family Medicine

## 2013-12-20 ENCOUNTER — Telehealth: Payer: Self-pay | Admitting: *Deleted

## 2013-12-20 NOTE — Telephone Encounter (Signed)
Pt called and wanted an rx sent for him for cough/congestion. I told pt that him that this could not be done since he has not been seen for this. Asked if his cough is productive. Pt reports the mucus is clear and that he only coughs more during the day.I gave pt following suggestions to try: delsym, mucinex, and sudafed or sudafed-pe for nasal congestion and cough. Told him that should his mucus change from clear to yellow or green to call and schedule an appt. Pt voiced understanding and agreed.Loralee Pacas[place], Rolan Wrightsman [place]

## 2014-01-15 ENCOUNTER — Other Ambulatory Visit: Payer: Self-pay | Admitting: Family Medicine

## 2014-01-17 ENCOUNTER — Telehealth: Payer: Self-pay | Admitting: *Deleted

## 2014-01-17 DIAGNOSIS — M549 Dorsalgia, unspecified: Secondary | ICD-10-CM

## 2014-01-17 NOTE — Telephone Encounter (Signed)
Ortho referral.Jashae Wiggs, Viann Shoveonya [place]

## 2014-03-22 ENCOUNTER — Other Ambulatory Visit: Payer: Self-pay | Admitting: Sports Medicine

## 2014-03-23 ENCOUNTER — Other Ambulatory Visit: Payer: Self-pay | Admitting: Family Medicine

## 2014-05-23 ENCOUNTER — Telehealth: Payer: Self-pay

## 2014-05-23 ENCOUNTER — Other Ambulatory Visit: Payer: Self-pay | Admitting: Sports Medicine

## 2014-05-23 NOTE — Telephone Encounter (Signed)
I spoke with patient in hopes to schedule a follow up appointment. He has a history of Hypogonadism and needs a PSA check. He informed me he would call back to schedule a follow up appointment.

## 2014-07-05 ENCOUNTER — Other Ambulatory Visit: Payer: Self-pay | Admitting: Family Medicine

## 2014-07-05 ENCOUNTER — Other Ambulatory Visit: Payer: Self-pay | Admitting: Sports Medicine

## 2014-08-11 ENCOUNTER — Other Ambulatory Visit: Payer: Self-pay | Admitting: Family Medicine

## 2014-08-11 ENCOUNTER — Other Ambulatory Visit: Payer: Self-pay | Admitting: *Deleted

## 2014-08-11 ENCOUNTER — Telehealth: Payer: Self-pay | Admitting: *Deleted

## 2014-08-11 NOTE — Telephone Encounter (Signed)
Pt called for refill of IBU I informed him that he will need to make an appt with his pcp for CPE refill given.Loralee PacasBarkley, Talulah Schirmer [place]

## 2014-09-01 ENCOUNTER — Ambulatory Visit (INDEPENDENT_AMBULATORY_CARE_PROVIDER_SITE_OTHER): Payer: 59 | Admitting: Family Medicine

## 2014-09-01 ENCOUNTER — Encounter: Payer: Self-pay | Admitting: Family Medicine

## 2014-09-01 VITALS — BP 137/84 | HR 75 | Temp 97.7°F | Ht 73.5 in | Wt 237.0 lb

## 2014-09-01 DIAGNOSIS — N138 Other obstructive and reflux uropathy: Secondary | ICD-10-CM

## 2014-09-01 DIAGNOSIS — Z0000 Encounter for general adult medical examination without abnormal findings: Secondary | ICD-10-CM

## 2014-09-01 DIAGNOSIS — N401 Enlarged prostate with lower urinary tract symptoms: Secondary | ICD-10-CM

## 2014-09-01 DIAGNOSIS — Z1211 Encounter for screening for malignant neoplasm of colon: Secondary | ICD-10-CM

## 2014-09-01 MED ORDER — MELOXICAM 15 MG PO TABS: ORAL_TABLET | ORAL | Status: DC

## 2014-09-01 MED ORDER — TADALAFIL 2.5 MG PO TABS
2.5000 mg | ORAL_TABLET | Freq: Every day | ORAL | Status: DC
Start: 2014-09-01 — End: 2015-09-24

## 2014-09-01 MED ORDER — IBUPROFEN 400 MG PO TABS: 400.0000 mg | ORAL_TABLET | Freq: Four times a day (QID) | ORAL | Status: DC | PRN

## 2014-09-01 NOTE — Patient Instructions (Signed)
Keep up a regular exercise program and make sure you are eating a healthy diet Try to eat 4 servings of dairy a day, or if you are lactose intolerant take a calcium with vitamin D daily.  Your vaccines are up to date.   

## 2014-09-01 NOTE — Progress Notes (Signed)
Subjective:    Patient ID: Steve Maxwell, male    DOB: [birth year], 58 y.o.   MRN: [id number]  HPI Here for CPE - overall he is doing well. He works out 5 days per week. He does not smoke or drink. He really tries to eat very healthy.   BPH - he would really like to go back on the daily dosing Cialis. He says it really helped his BPH. He had tried Avodart in the past. He felt like it didn't work that well and it caused sexual side effects.  Review of Systems Comprehensive ROS is neg.   BP 137/84  Pulse 75  Temp(Src) 97.7 F (36.5 C)  Ht 6' 1.5" (1.867 m)  Wt 237 lb (107.502 kg)  BMI 30.84 kg/m2    Allergies  Allergen Reactions  . Avodart [Dutasteride] Other (See Comments)    Sexual dysfunction  . Viagra [Sildenafil Citrate] Nausea Only    Headaches    Past Medical History  Diagnosis Date  . 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 HYPERTROPHY PROSTATE W/O UR OBST & OTH LUTS 01/11/2010    Qualifier: Diagnosis of  By: Linford ArnoldMetheney MD, Santina Evansatherine      Past Surgical History  Procedure Laterality Date  . Right rotator cuff surgery    . Back surgery  2008  . Elbow surgery  2-09    Left and right  . Knee surgery      right x 2  . Knee surgery      Left   . Ankle surgery      Right   . Plantar fascia release      Right   . Carpal tunnel release      right    History   Social History  . Marital Status: Married    Spouse Name: N/A    Number of Children: 2  . Years of Education: N/A   Occupational History  . IT     [employer]   Social History Main Topics  . Smoking status: Never Smoker   . Smokeless tobacco: Not on file  . Alcohol Use: No  . Drug Use: No  . Sexual Activity:    Other Topics Concern  . Not on file   Social History Narrative   Working out every day of  The week.     Family History  Problem Relation Age of Onset  . Alcohol abuse Mother   . Diabetes Father   . Stroke Father   . Thyroid disease Father   . Other  Father     back problems  . Cancer Other     Outpatient Encounter Prescriptions as of 09/01/2014  Medication Sig  . ibuprofen (ADVIL,MOTRIN) 400 MG tablet Take 1 tablet (400 mg total) by mouth every 6 (six) hours as needed.  . meloxicam (MOBIC) 15 MG tablet TAKE ONE ONCE DAILY AS NEEDED FOR PAIN  . [DISCONTINUED] ibuprofen (ADVIL,MOTRIN) 400 MG tablet Take 400 mg by mouth every 6 (six) hours as needed.  . [DISCONTINUED] meloxicam (MOBIC) 15 MG tablet TAKE ONE ONCE DAILY AS NEEDED FOR PAIN  . [DISCONTINUED] Tadalafil 2.5 MG TABS One by mouth daily for treatment of BPH.  . Tadalafil (CIALIS) 2.5 MG TABS Take 1 tablet (2.5 mg total) by mouth daily. For BPH. Failed Avodart.  . [DISCONTINUED] ANDROGEL PUMP 20.25 MG/ACT (1.62%) GEL APPLY TWO PUMPS TO THE SKIN DAILY AS DIRECTED.  . [DISCONTINUED] fluticasone (FLONASE) 50 MCG/ACT nasal spray Place  2 sprays into the nose daily.  . [DISCONTINUED] ibuprofen (ADVIL,MOTRIN) 400 MG tablet Take 1 tablet (400 mg total) by mouth every 6 (six) hours as needed. APPOINTMENT REQUIRED FOR FUTURE REFILLS  . [DISCONTINUED] meloxicam (MOBIC) 15 MG tablet TAKE ONE TABLET BY MOUTH ONCE DAILY IN THE MORNING WITH BREAKFAST FOR TWO WEEKS, THEN TAKE ONE ONCE DAILY AS NEEDED FOR PAIN  . [DISCONTINUED] Testosterone (ANDROGEL PUMP) 20.25 MG/ACT (1.62%) GEL Place 2 Act onto the skin daily.  . [DISCONTINUED] Testosterone 20.25 MG/ACT (1.62%) GEL Place 2 Act onto the skin daily. 1 pump to each upper arm and rub in in the AM.  . [DISCONTINUED] valACYclovir (VALTREX) 1000 MG tablet TAKE TWO TABLETS BY MOUTH TWICE DAILY           Objective:   Physical Exam  Constitutional: He is oriented to person, place, and time. He appears well-developed and well-nourished.  HENT:  Head: Normocephalic and atraumatic.  Right Ear: External ear normal.  Left Ear: External ear normal.  Nose: Nose normal.  Mouth/Throat: Oropharynx is clear and moist.  Eyes: Conjunctivae and EOM are normal.  Pupils are equal, round, and reactive to light.  Neck: Normal range of motion. Neck supple. No thyromegaly present.  Cardiovascular: Normal rate, regular rhythm, normal heart sounds and intact distal pulses.   Pulmonary/Chest: Effort normal and breath sounds normal.  Abdominal: Soft. Bowel sounds are normal. He exhibits no distension and no mass. There is no tenderness. There is no rebound and no guarding.  Musculoskeletal: Normal range of motion.  Lymphadenopathy:    He has no cervical adenopathy.  Neurological: He is alert and oriented to person, place, and time. He has normal reflexes.  Skin: Skin is warm and dry.  Psychiatric: He has a normal mood and affect. His behavior is normal. Judgment and thought content normal.          Assessment & Plan:  CPE Keep up a regular exercise program and make sure you are eating a healthy diet Try to eat 4 servings of dairy a day, or if you are lactose intolerant take a calcium with vitamin D daily.   Flu sot given.  Discussed shingles vaccine. It may or may not be covered before age 59. He says he will wait until next year. He turns 59 in 2 months.   BPH-we'll recheck PSA today. Will try to write for the Cialis 2.5 mg daily again. New prescription sent. It will likely require prior authorization.

## 2014-09-02 ENCOUNTER — Other Ambulatory Visit: Payer: Self-pay | Admitting: *Deleted

## 2014-09-02 DIAGNOSIS — R748 Abnormal levels of other serum enzymes: Secondary | ICD-10-CM

## 2014-09-02 LAB — LIPID PANEL
CHOLESTEROL: 166 mg/dL (ref 0–200)
HDL: 37 mg/dL — ABNORMAL LOW (ref >39)
LDL Cholesterol: 105 mg/dL — ABNORMAL HIGH (ref 0–99)
TRIGLYCERIDES: 118 mg/dL (ref <150)
Total CHOL/HDL Ratio: 4.5 Ratio
VLDL: 24 mg/dL (ref 0–40)

## 2014-09-02 LAB — PSA: PSA: 0.75 ng/mL (ref <=4.00)

## 2014-09-02 LAB — COMPLETE METABOLIC PANEL WITH GFR
ALT: 32 U/L (ref 0–53)
AST: 48 U/L — AB (ref 0–37)
Albumin: 4.6 g/dL (ref 3.5–5.2)
Alkaline Phosphatase: 83 U/L (ref 39–117)
BUN: 16 mg/dL (ref 6–23)
CALCIUM: 9.6 mg/dL (ref 8.4–10.5)
CHLORIDE: 103 meq/L (ref 96–112)
CO2: 29 mEq/L (ref 19–32)
Creat: 1.37 mg/dL — ABNORMAL HIGH (ref 0.50–1.35)
GFR, EST AFRICAN AMERICAN: 65 mL/min
GFR, EST NON AFRICAN AMERICAN: 56 mL/min — AB
GLUCOSE: 105 mg/dL — AB (ref 70–99)
Potassium: 4.6 mEq/L (ref 3.5–5.3)
Sodium: 141 mEq/L (ref 135–145)
Total Bilirubin: 1 mg/dL (ref 0.2–1.2)
Total Protein: 7.4 g/dL (ref 6.0–8.3)

## 2014-10-12 ENCOUNTER — Ambulatory Visit (INDEPENDENT_AMBULATORY_CARE_PROVIDER_SITE_OTHER): Payer: 59 | Admitting: Family Medicine

## 2014-10-12 ENCOUNTER — Encounter: Payer: Self-pay | Admitting: Family Medicine

## 2014-10-12 VITALS — BP 134/87 | HR 92 | Temp 97.9°F | Wt 234.0 lb

## 2014-10-12 DIAGNOSIS — R197 Diarrhea, unspecified: Secondary | ICD-10-CM

## 2014-10-12 DIAGNOSIS — R1110 Vomiting, unspecified: Secondary | ICD-10-CM

## 2014-10-12 MED ORDER — DIPHENOXYLATE-ATROPINE 2.5-0.025 MG PO TABS: 1.0000 | ORAL_TABLET | Freq: Four times a day (QID) | ORAL | Status: DC | PRN

## 2014-10-12 MED ORDER — PROMETHAZINE HCL 25 MG PO TABS: 25.0000 mg | ORAL_TABLET | Freq: Three times a day (TID) | ORAL | Status: DC | PRN

## 2014-10-12 NOTE — Progress Notes (Signed)
CC: Steve Maxwell is a 58 y.o. male is here for vomiting and diarrhea   Subjective: HPI:  Vomiting and diarrhea that came on acutely Sunday this past weekend. He is unable to quantitate how often he's been vomiting or having bowel movements but it's at least more than 5 a day. Fortunately as of this morning he has not had any bowel movements or vomiting but does have nausea today. He denies abdominal pain but does feel like his stomach is "churning" and there is no radicular component to this discomfort. He denies coffee ground emesis, blood in vomit nor stool. He knows of no known sick contacts. He's had subjective fevers and sweats that come and go. He states his appetite if anything has increased. He's tried soft drinks and orange juice but it usually makes him nauseous to the point where he throws up. He denies any cough, shortness of breath, wheezing, skin changes, rash, joint pain, dysuria or any genitourinary complaints   Review Of Systems Outlined In HPI  Past Medical History  Diagnosis Date  . 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 HYPERTROPHY PROSTATE W/O UR OBST & OTH LUTS 01/11/2010    Qualifier: Diagnosis of  By: Linford ArnoldMetheney MD, Santina Evansatherine      Past Surgical History  Procedure Laterality Date  . Right rotator cuff surgery    . Back surgery  2008  . Elbow surgery  2-09    Left and right  . Knee surgery      right x 2  . Knee surgery      Left   . Ankle surgery      Right   . Plantar fascia release      Right   . Carpal tunnel release      right   Family History  Problem Relation Age of Onset  . Alcohol abuse Mother   . Diabetes Father   . Stroke Father   . Thyroid disease Father   . Other Father     back problems  . Cancer Other     History   Social History  . Marital Status: Divorced    Spouse Name: N/A    Number of Children: 2  . Years of Education: N/A   Occupational History  . IT     [employer]   Social History Main Topics   . Smoking status: Never Smoker   . Smokeless tobacco: Not on file  . Alcohol Use: No  . Drug Use: No  . Sexual Activity: Not on file   Other Topics Concern  . Not on file   Social History Narrative   Working out every day of  The week.      Objective: BP 134/87 mmHg  Pulse 92  Temp(Src) 97.9 F (36.6 C) (Oral)  Wt 234 lb (106.142 kg)  General: Alert and Oriented, No Acute Distress HEENT: Pupils equal, round, reactive to light. Conjunctivae clear.  External ears unremarkable, canals clear with intact TMs with appropriate landmarks.  Middle ear appears open without effusion. Pink inferior turbinates.  Moist mucous membranes, pharynx without inflammation nor lesions.  Neck supple without palpable lymphadenopathy nor abnormal masses. Lungs: Clear to auscultation bilaterally, no wheezing/ronchi/rales.  Comfortable work of breathing. Good air movement. Cardiac: Regular rate and rhythm. Normal S1/S2.  No murmurs, rubs, nor gallops.   Abdomen: Increased bowel sounds, soft, nontender, no guarding or rebound. No palpable masses Extremities: No peripheral edema.  Strong peripheral pulses.  Mental Status: No  depression, anxiety, nor agitation. Skin: Warm and dry.  Assessment & Plan: [name] was seen today for vomiting and diarrhea.  Diagnoses and associated orders for this visit:  Vomiting and diarrhea - promethazine (PHENERGAN) 25 MG tablet; Take 1 tablet (25 mg total) by mouth every 8 (eight) hours as needed for nausea or vomiting. - diphenoxylate-atropine (LOMOTIL) 2.5-0.025 MG per tablet; Take 1 tablet by mouth 4 (four) times daily as needed for diarrhea or loose stools.    Vomiting and diarrhea most likely felt to be viral gastroenteritis possibly [place] virus. Discussed small frequent sips of Gatorade at 5 minute intervals along with using promethazine to reduce nausea and Lomotil to reduce diarrhea. After 24 hours of no vomiting or diarrhea begin advancing diet with a liquid diet  then 24 hours later if tolerated advance diet to bland diet.Signs and symptoms requring emergent/urgent reevaluation were discussed with the patient.   Return if symptoms worsen or fail to improve.

## 2014-11-28 ENCOUNTER — Other Ambulatory Visit: Payer: Self-pay | Admitting: Family Medicine

## 2015-01-17 ENCOUNTER — Other Ambulatory Visit: Payer: Self-pay | Admitting: Family Medicine

## 2015-01-23 ENCOUNTER — Telehealth: Payer: Self-pay | Admitting: *Deleted

## 2015-01-23 ENCOUNTER — Other Ambulatory Visit: Payer: Self-pay | Admitting: *Deleted

## 2015-01-23 DIAGNOSIS — E291 Testicular hypofunction: Secondary | ICD-10-CM

## 2015-01-23 NOTE — Telephone Encounter (Signed)
Wrong lab ordered.

## 2015-01-23 NOTE — Telephone Encounter (Signed)
He is also due for repeat liver function. See if they can add onto his labs.

## 2015-01-23 NOTE — Telephone Encounter (Signed)
Testosterone labs ordered.  Pt requested a refill on his androgel but it's been almost 2 years since last set of lab work was done. Just an FYI.

## 2015-01-23 NOTE — Telephone Encounter (Signed)
Labs ordered.

## 2015-01-24 ENCOUNTER — Other Ambulatory Visit: Payer: Self-pay | Admitting: *Deleted

## 2015-01-24 ENCOUNTER — Ambulatory Visit (INDEPENDENT_AMBULATORY_CARE_PROVIDER_SITE_OTHER): Payer: 59 | Admitting: Family Medicine

## 2015-01-24 VITALS — BP 136/91 | HR 82 | Ht 73.0 in | Wt 236.0 lb

## 2015-01-24 DIAGNOSIS — R748 Abnormal levels of other serum enzymes: Secondary | ICD-10-CM

## 2015-01-24 DIAGNOSIS — Z23 Encounter for immunization: Secondary | ICD-10-CM

## 2015-01-24 NOTE — Progress Notes (Signed)
   Subjective:    Patient ID: Steve Maxwell, male    DOB: 1956-10-10, 59 y.o.   MRN: [id number]  HPI Patient came into office today for varicella-zoster vaccine. Patient denies any recent illness, and there are no changes in his medications. Patient states he needs some lab work done, and is going to get that drawn after his vaccination.   Review of Systems     Objective:   Physical Exam        Assessment & Plan:  Patient tolerated injection well in Right arm. No questions, and no concerns.

## 2015-01-25 LAB — HEPATITIS PANEL, ACUTE
HCV Ab: NEGATIVE
HEP A IGM: NONREACTIVE
HEP B S AG: NEGATIVE
Hep B C IgM: NONREACTIVE

## 2015-01-25 LAB — TESTOSTERONE, FREE, TOTAL, SHBG
Sex Hormone Binding: 25 nmol/L (ref 22–77)
TESTOSTERONE-% FREE: 2.2 % (ref 1.6–2.9)
Testosterone, Free: 38.4 pg/mL — ABNORMAL LOW (ref 47.0–244.0)
Testosterone: 175 ng/dL — ABNORMAL LOW (ref 300–890)

## 2015-01-26 ENCOUNTER — Other Ambulatory Visit: Payer: Self-pay | Admitting: *Deleted

## 2015-01-26 MED ORDER — TESTOSTERONE 20.25 MG/ACT (1.62%) TD GEL: TRANSDERMAL | Status: DC

## 2015-02-13 ENCOUNTER — Telehealth: Payer: Self-pay | Admitting: Family Medicine

## 2015-02-13 NOTE — Telephone Encounter (Signed)
I spoke with Mr. Steve Maxwell and he emailed me his card so I sent pa through cover my meds and waiting on auth. - CF

## 2015-02-13 NOTE — Telephone Encounter (Signed)
Received a fax for prior auth on Androgel patients insurance has changed and i need new card. I called and left a message for mr. Steve Maxwell to return my call and I will complete the auth once I receive the new card. - CF

## 2015-02-15 NOTE — Telephone Encounter (Signed)
Received fax from [employer] and Androgel has been approved from 02/14/2015 - 02/14/2016. - CF

## 2015-03-01 ENCOUNTER — Other Ambulatory Visit: Payer: Self-pay | Admitting: Family Medicine

## 2015-06-22 ENCOUNTER — Other Ambulatory Visit: Payer: Self-pay | Admitting: Family Medicine

## 2015-08-31 ENCOUNTER — Ambulatory Visit (INDEPENDENT_AMBULATORY_CARE_PROVIDER_SITE_OTHER): Payer: 59 | Admitting: Family Medicine

## 2015-08-31 VITALS — BP 140/73 | HR 64 | Temp 98.1°F | Wt 235.0 lb

## 2015-08-31 DIAGNOSIS — Z23 Encounter for immunization: Secondary | ICD-10-CM

## 2015-09-24 ENCOUNTER — Other Ambulatory Visit: Payer: Self-pay | Admitting: Sports Medicine

## 2015-09-24 ENCOUNTER — Other Ambulatory Visit: Payer: Self-pay | Admitting: Family Medicine

## 2015-10-17 ENCOUNTER — Encounter: Payer: Self-pay | Admitting: Family Medicine

## 2015-10-17 ENCOUNTER — Ambulatory Visit (INDEPENDENT_AMBULATORY_CARE_PROVIDER_SITE_OTHER): Payer: 59 | Admitting: Family Medicine

## 2015-10-17 VITALS — BP 141/93 | HR 61 | Wt 240.0 lb

## 2015-10-17 DIAGNOSIS — M19049 Primary osteoarthritis, unspecified hand: Secondary | ICD-10-CM

## 2015-10-17 DIAGNOSIS — Z13220 Encounter for screening for lipoid disorders: Secondary | ICD-10-CM | POA: Diagnosis not present

## 2015-10-17 DIAGNOSIS — N400 Enlarged prostate without lower urinary tract symptoms: Secondary | ICD-10-CM

## 2015-10-17 DIAGNOSIS — Z1211 Encounter for screening for malignant neoplasm of colon: Secondary | ICD-10-CM

## 2015-10-17 DIAGNOSIS — H9201 Otalgia, right ear: Secondary | ICD-10-CM

## 2015-10-17 MED ORDER — CELECOXIB 200 MG PO CAPS: 200.0000 mg | ORAL_CAPSULE | Freq: Two times a day (BID) | ORAL | Status: DC | PRN

## 2015-10-17 MED ORDER — PREDNISONE 20 MG PO TABS: 40.0000 mg | ORAL_TABLET | Freq: Every day | ORAL | Status: DC

## 2015-10-17 MED ORDER — AMOXICILLIN-POT CLAVULANATE 875-125 MG PO TABS: 1.0000 | ORAL_TABLET | Freq: Two times a day (BID) | ORAL | Status: DC

## 2015-10-17 MED ORDER — TADALAFIL 5 MG PO TABS: 5.0000 mg | ORAL_TABLET | Freq: Every day | ORAL | Status: DC | PRN

## 2015-10-17 NOTE — Progress Notes (Signed)
   Subjective:    Patient ID: Steve Maxwell, male    DOB: 1956/10/24, 59 y.o.   MRN: [id number]  HPI Patient comes in today because he's had some left ear pain and sinus pain for about 2 weeks. She's had a lot of pressure in the ear. To the point where he's been taking at it because it's been uncomfortable. He's also started getting some headaches just above the nasal bridge which is unusual for him. He denies any significant nasal congestion. He has had some mild sore throat. No cough. No fevers chills or sweats. Next  BPH-he would like a refill on the Cialis but would like to go back up to 5 mg. He was previously taking 5 mg but at some point it was refill for the 2.5 mg and he's not sure why. He feels like it's much more affected than the Avodart.  Osteoarthritis-he has some deformity of the DIP joints on multiple fingers in his hands. He said he played ball for years and had a prior fracture. He takes Mobic mostly for the pain but feels like it's not working as well as it used to and would like to try something else if possible.   Review of Systems     Objective:   Physical Exam  Constitutional: He is oriented to person, place, and time. He appears well-developed and well-nourished.  HENT:  Head: Normocephalic and atraumatic.  Right Ear: External ear normal.  Left Ear: External ear normal.  Nose: Nose normal.  Mouth/Throat: Oropharynx is clear and moist.  TMs and canals are clear.   Eyes: Conjunctivae and EOM are normal. Pupils are equal, round, and reactive to light.  Neck: Neck supple. No thyromegaly present.  Cardiovascular: Normal rate and normal heart sounds.   Pulmonary/Chest: Effort normal and breath sounds normal.  Lymphadenopathy:    He has no cervical adenopathy.  Neurological: He is alert and oriented to person, place, and time.  Skin: Skin is warm and dry.  Psychiatric: He has a normal mood and affect.          Assessment & Plan:  Right otalgia with sinus  pain-since she's had this for almost 2 weeks. His ear exam was fairly normal that he did have some cerumen which I curetted. Tympanometry performed.tympanogram performed with widened graph on the left ear. Will tx with prednisone  BPH-we'll send over new prescription for Cialis 5 mg to the pharmacy. Refill for one year. Due for PSA.    Osteoarthritis-we'll try a different NSAID. We'll try Celebrex now that's generic.  Due for CMP and lipids.  Due for colon cancer screening.  He says he never got a call last year.  Will try to schedule again.

## 2015-10-18 ENCOUNTER — Encounter: Payer: Self-pay | Admitting: Family Medicine

## 2015-10-24 ENCOUNTER — Encounter: Payer: Self-pay | Admitting: Family Medicine

## 2015-10-26 ENCOUNTER — Other Ambulatory Visit: Payer: Self-pay

## 2015-10-26 MED ORDER — TADALAFIL 5 MG PO TABS: ORAL_TABLET | ORAL | Status: DC

## 2015-11-27 ENCOUNTER — Other Ambulatory Visit: Payer: Self-pay | Admitting: Family Medicine

## 2015-11-28 ENCOUNTER — Telehealth: Payer: Self-pay

## 2015-11-28 MED ORDER — IBUPROFEN 800 MG PO TABS: 800.0000 mg | ORAL_TABLET | Freq: Three times a day (TID) | ORAL | Status: AC | PRN

## 2015-11-28 NOTE — Telephone Encounter (Signed)
New rx sent

## 2015-11-28 NOTE — Telephone Encounter (Signed)
Patient would like a refill on ibuprofen 800 mg. Ibuprofen is not on medication list. Please advise.

## 2015-11-28 NOTE — Telephone Encounter (Signed)
Left VM for Pt advising of new Rx.  

## 2016-01-18 ENCOUNTER — Other Ambulatory Visit: Payer: Self-pay | Admitting: *Deleted

## 2016-01-18 MED ORDER — TADALAFIL 5 MG PO TABS: ORAL_TABLET | ORAL | Status: DC

## 2016-01-19 ENCOUNTER — Telehealth: Payer: Self-pay | Admitting: *Deleted

## 2016-01-19 NOTE — Telephone Encounter (Signed)
Prior auth for cialis initiated through [employer]

## 2016-01-24 ENCOUNTER — Other Ambulatory Visit: Payer: Self-pay | Admitting: *Deleted

## 2016-01-24 MED ORDER — TADALAFIL 2.5 MG PO TABS: ORAL_TABLET | ORAL | Status: DC

## 2016-01-25 NOTE — Telephone Encounter (Signed)
Prior auth approved for cialis 5 mg from 01/24/2016 through 01/23/2017  Left discreet message on patients voicemail

## 2016-01-30 ENCOUNTER — Telehealth: Payer: Self-pay

## 2016-01-30 ENCOUNTER — Ambulatory Visit (INDEPENDENT_AMBULATORY_CARE_PROVIDER_SITE_OTHER): Payer: Managed Care, Other (non HMO) | Admitting: Family Medicine

## 2016-01-30 ENCOUNTER — Encounter: Payer: Self-pay | Admitting: Family Medicine

## 2016-01-30 VITALS — BP 138/78 | HR 82 | Wt 220.0 lb

## 2016-01-30 DIAGNOSIS — J209 Acute bronchitis, unspecified: Secondary | ICD-10-CM | POA: Diagnosis not present

## 2016-01-30 DIAGNOSIS — R509 Fever, unspecified: Secondary | ICD-10-CM

## 2016-01-30 LAB — POCT INFLUENZA A/B
INFLUENZA A, POC: NEGATIVE
INFLUENZA B, POC: NEGATIVE

## 2016-01-30 MED ORDER — HYDROCODONE-HOMATROPINE 5-1.5 MG/5ML PO SYRP: 5.0000 mL | ORAL_SOLUTION | Freq: Every evening | ORAL | Status: DC | PRN

## 2016-01-30 MED ORDER — BENZONATATE 200 MG PO CAPS: 200.0000 mg | ORAL_CAPSULE | Freq: Two times a day (BID) | ORAL | Status: DC | PRN

## 2016-01-30 NOTE — Telephone Encounter (Signed)
The pharmacy called and states the Hycodan is on back order and they recommend Robitussin AC.

## 2016-01-30 NOTE — Patient Instructions (Signed)

## 2016-01-30 NOTE — Telephone Encounter (Signed)
Ok to substitute?

## 2016-01-30 NOTE — Progress Notes (Signed)
   Subjective:    Patient ID: Steve Maxwell, male    DOB: [birth year], 60 y.o.   MRN: [id number]  HPI Reason says he started feeling sick about 5 days ago. He started getting some congestion in his mid chest area. He took a decongestant and actually felt a little bit better the next day. By Sunday, approximately 3 days ago he felt significantly worse. He developed nasal congestion, fever, body aches adn chills.  Tried a decongestant this AM as well. No sore throat. No GI symptoms. His cough is wet and productive. He denies feeling short of breath.     Review of Systems     Objective:   Physical Exam  Constitutional: He is oriented to person, place, and time. He appears well-developed and well-nourished.  HENT:  Head: Normocephalic and atraumatic.  Right Ear: External ear normal.  Left Ear: External ear normal.  Nose: Nose normal.  Mouth/Throat: Oropharynx is clear and moist.  TMs and canals are clear.   Eyes: Conjunctivae and EOM are normal. Pupils are equal, round, and reactive to light.  Neck: Neck supple. No thyromegaly present.  Cardiovascular: Normal rate and normal heart sounds.   Pulmonary/Chest: Effort normal and breath sounds normal.  Diffuse coarse Breath sounds. No wheezing.   Lymphadenopathy:    He has no cervical adenopathy.  Neurological: He is alert and oriented to person, place, and time.  Skin: Skin is warm and dry.  Psychiatric: He has a normal mood and affect.          Assessment & Plan:  Acute bronchitis - influenza test was negative today. Differently stop over-the-counter decongestant recommend a product such as Coricidin which is safer for people who have high blood pressure.  Explained we don't use antibiotics.    Call if getting worse or develops SOB or if fever lasting more than 5 days.

## 2016-01-31 MED ORDER — GUAIFENESIN-CODEINE 100-10 MG/5ML PO SOLN: 5.0000 mL | Freq: Three times a day (TID) | ORAL | Status: DC | PRN

## 2016-01-31 NOTE — Telephone Encounter (Signed)
Advised patient to pick up prescription later today.

## 2016-02-12 ENCOUNTER — Other Ambulatory Visit: Payer: Self-pay | Admitting: Sports Medicine

## 2016-02-19 ENCOUNTER — Telehealth: Payer: Self-pay | Admitting: *Deleted

## 2016-02-19 NOTE — Telephone Encounter (Signed)
Called pt to find out which cough med he is taking. Asked that he rtn call.Laureen Ochs.Brynda Heick, Viann Shoveonya Lynetta

## 2016-05-02 ENCOUNTER — Other Ambulatory Visit: Payer: Self-pay | Admitting: Sports Medicine

## 2016-05-06 ENCOUNTER — Other Ambulatory Visit: Payer: Self-pay | Admitting: Family Medicine

## 2016-05-06 NOTE — Telephone Encounter (Signed)
Please call patient. I have Mobic and Celebrex both on his medication list. He cannot have a prescription for both. He needs to pick which one he wants to stick with and we can refill that point and take the other one off of his list.

## 2016-05-08 ENCOUNTER — Other Ambulatory Visit: Payer: Self-pay

## 2016-05-08 MED ORDER — MELOXICAM 15 MG PO TABS: ORAL_TABLET | ORAL | Status: DC

## 2016-06-06 NOTE — Telephone Encounter (Signed)
Pt informed. He stated that neither one of these medications really work however, if he has to choose he will keep the mobic. He did ask if Dr. Linford Arnold had any suggestions of any other medication that would work better to help with his hand pain (osteoarthritis bilteral). I told him that I would fwd to her for any recommendations and get back to him with her suggestions. He also wanted her to know that he has lost weight an feels like this has helped him some with the pain.Loralee [place]

## 2016-06-11 NOTE — Telephone Encounter (Signed)
We could try volteran gel. It is rubbed directly on the hand joints.  I have had a lot of success with this for osteoarthritis.  We can send a prescription if he would like to try it. At had several people tell me that worked much better than the Mobic. Of course everyone is a little different.

## 2016-06-19 MED ORDER — DICLOFENAC SODIUM 1 % TD GEL: 4.0000 g | Freq: Four times a day (QID) | TRANSDERMAL | 5 refills | Status: AC

## 2016-06-21 ENCOUNTER — Telehealth: Payer: Self-pay | Admitting: *Deleted

## 2016-06-21 NOTE — Telephone Encounter (Signed)
Prior auth initiated through [employer] [id number] -

## 2016-07-23 ENCOUNTER — Ambulatory Visit (INDEPENDENT_AMBULATORY_CARE_PROVIDER_SITE_OTHER): Payer: Managed Care, Other (non HMO) | Admitting: Family Medicine

## 2016-07-23 ENCOUNTER — Encounter: Payer: Self-pay | Admitting: Family Medicine

## 2016-07-23 VITALS — BP 128/80 | HR 68 | Ht 73.0 in | Wt 225.0 lb

## 2016-07-23 DIAGNOSIS — M545 Low back pain, unspecified: Secondary | ICD-10-CM

## 2016-07-23 DIAGNOSIS — Z23 Encounter for immunization: Secondary | ICD-10-CM

## 2016-07-23 MED ORDER — PREDNISONE 20 MG PO TABS: 40.0000 mg | ORAL_TABLET | Freq: Every day | ORAL | 0 refills | Status: DC

## 2016-07-23 NOTE — Patient Instructions (Addendum)
Low Back Sprain With Rehab A sprain is an injury in which a ligament is torn. The ligaments of the lower back are vulnerable to sprains. However, they are strong and require great force to be injured. These ligaments are important for stabilizing the spinal column. Sprains are classified into three categories. Grade 1 sprains cause pain, but the tendon is not lengthened. Grade 2 sprains include a lengthened ligament, due to the ligament being stretched or partially ruptured. With grade 2 sprains there is still function, although the function may be decreased. Grade 3 sprains involve a complete tear of the tendon or muscle, and function is usually impaired. SYMPTOMS   Severe pain in the lower back.  Sometimes, a feeling of a "pop," "snap," or tear, at the time of injury.  Tenderness and sometimes swelling at the injury site.  Uncommonly, bruising (contusion) within 48 hours of injury.  Muscle spasms in the back. CAUSES  Low back sprains occur when a force is placed on the ligaments that is greater than they can handle. Common causes of injury include:  Performing a stressful act while off-balance.  Repetitive stressful activities that involve movement of the lower back.  Direct hit (trauma) to the lower back. RISK INCREASES WITH:  Contact sports (football, wrestling).  Collisions (major skiing accidents).  Sports that require throwing or lifting (baseball, weightlifting).  Sports involving twisting of the spine (gymnastics, diving, tennis, golf).  Poor strength and flexibility.  Inadequate protection.  Previous back injury or surgery (especially fusion). PREVENTION  Wear properly fitted and padded protective equipment.  Warm up and stretch properly before activity.  Allow for adequate recovery between workouts.  Maintain physical fitness:  Strength, flexibility, and endurance.  Cardiovascular fitness.  Maintain a healthy body weight. PROGNOSIS  If treated properly,  low back sprains usually heal with non-surgical treatment. The length of time for healing depends on the severity of the injury.  RELATED COMPLICATIONS   Recurring symptoms, resulting in a chronic problem.  Chronic inflammation and pain in the low back.  Delayed healing or resolution of symptoms, especially if activity is resumed too soon.  Prolonged impairment.  Unstable or arthritic joints of the low back. TREATMENT  Treatment first involves the use of ice and medicine, to reduce pain and inflammation. The use of strengthening and stretching exercises may help reduce pain with activity. These exercises may be performed at home or with a therapist. Severe injuries may require referral to a therapist for further evaluation and treatment, such as ultrasound. Your caregiver may advise that you wear a back brace or corset, to help reduce pain and discomfort. Often, prolonged bed rest results in greater harm then benefit. Corticosteroid injections may be recommended. However, these should be reserved for the most serious cases. It is important to avoid using your back when lifting objects. At night, sleep on your back on a firm mattress, with a pillow placed under your knees. If non-surgical treatment is unsuccessful, surgery may be needed.  MEDICATION   If pain medicine is needed, nonsteroidal anti-inflammatory medicines (aspirin and ibuprofen), or other minor pain relievers (acetaminophen), are often advised.  Do not take pain medicine for 7 days before surgery.  Prescription pain relievers may be given, if your caregiver thinks they are needed. Use only as directed and only as much as you need.  Ointments applied to the skin may be helpful.  Corticosteroid injections may be given by your caregiver. These injections should be reserved for the most serious cases, because   they may only be given a certain number of times. HEAT AND COLD  Cold treatment (icing) should be applied for 10 to 15  minutes every 2 to 3 hours for inflammation and pain, and immediately after activity that aggravates your symptoms. Use ice packs or an ice massage.  Heat treatment may be used before performing stretching and strengthening activities prescribed by your caregiver, physical therapist, or athletic trainer. Use a heat pack or a warm water soak. SEEK MEDICAL CARE IF:   Symptoms get worse or do not improve in 2 to 4 weeks, despite treatment.  You develop numbness or weakness in either leg.  You lose bowel or bladder function.  Any of the following occur after surgery: fever, increased pain, swelling, redness, drainage of fluids, or bleeding in the affected area.  New, unexplained symptoms develop. (Drugs used in treatment may produce side effects.) EXERCISES  RANGE OF MOTION (ROM) AND STRETCHING EXERCISES - Low Back Sprain Most people with lower back pain will find that their symptoms get worse with excessive bending forward (flexion) or arching at the lower back (extension). The exercises that will help resolve your symptoms will focus on the opposite motion.  Your physician, physical therapist or athletic trainer will help you determine which exercises will be most helpful to resolve your lower back pain. Do not complete any exercises without first consulting with your caregiver. Discontinue any exercises which make your symptoms worse, until you speak to your caregiver. If you have pain, numbness or tingling which travels down into your buttocks, leg or foot, the goal of the therapy is for these symptoms to move closer to your back and eventually resolve. Sometimes, these leg symptoms will get better, but your lower back pain may worsen. This is often an indication of progress in your rehabilitation. Be very alert to any changes in your symptoms and the activities in which you participated in the 24 hours prior to the change. Sharing this information with your caregiver will allow him or her to most  efficiently treat your condition. These exercises may help you when beginning to rehabilitate your injury. Your symptoms may resolve with or without further involvement from your physician, physical therapist or athletic trainer. While completing these exercises, remember:   Restoring tissue flexibility helps normal motion to return to the joints. This allows healthier, less painful movement and activity.  An effective stretch should be held for at least 30 seconds.  A stretch should never be painful. You should only feel a gentle lengthening or release in the stretched tissue. FLEXION RANGE OF MOTION AND STRETCHING EXERCISES: STRETCH - Flexion, Single Knee to Chest   Lie on a firm bed or floor with both legs extended in front of you.  Keeping one leg in contact with the floor, bring your opposite knee to your chest. Hold your leg in place by either grabbing behind your thigh or at your knee.  Pull until you feel a gentle stretch in your low back. Hold __________ seconds.  Slowly release your grasp and repeat the exercise with the opposite side. Repeat __________ times. Complete this exercise __________ times per day.  STRETCH - Flexion, Double Knee to Chest  Lie on a firm bed or floor with both legs extended in front of you.  Keeping one leg in contact with the floor, bring your opposite knee to your chest.  Tense your stomach muscles to support your back and then lift your other knee to your chest. Hold your legs in   place by either grabbing behind your thighs or at your knees.  Pull both knees toward your chest until you feel a gentle stretch in your low back. Hold __________ seconds.  Tense your stomach muscles and slowly return one leg at a time to the floor. Repeat __________ times. Complete this exercise __________ times per day.  STRETCH - Low Trunk Rotation  Lie on a firm bed or floor. Keeping your legs in front of you, bend your knees so they are both pointed toward the  ceiling and your feet are flat on the floor.  Extend your arms out to the side. This will stabilize your upper body by keeping your shoulders in contact with the floor.  Gently and slowly drop both knees together to one side until you feel a gentle stretch in your low back. Hold for __________ seconds.  Tense your stomach muscles to support your lower back as you bring your knees back to the starting position. Repeat the exercise to the other side. Repeat __________ times. Complete this exercise __________ times per day  EXTENSION RANGE OF MOTION AND FLEXIBILITY EXERCISES: STRETCH - Extension, Prone on Elbows   Lie on your stomach on the floor, a bed will be too soft. Place your palms about shoulder width apart and at the height of your head.  Place your elbows under your shoulders. If this is too painful, stack pillows under your chest.  Allow your body to relax so that your hips drop lower and make contact more completely with the floor.  Hold this position for __________ seconds.  Slowly return to lying flat on the floor. Repeat __________ times. Complete this exercise __________ times per day.  RANGE OF MOTION - Extension, Prone Press Ups  Lie on your stomach on the floor, a bed will be too soft. Place your palms about shoulder width apart and at the height of your head.  Keeping your back as relaxed as possible, slowly straighten your elbows while keeping your hips on the floor. You may adjust the placement of your hands to maximize your comfort. As you gain motion, your hands will come more underneath your shoulders.  Hold this position __________ seconds.  Slowly return to lying flat on the floor. Repeat __________ times. Complete this exercise __________ times per day.  RANGE OF MOTION- Quadruped, Neutral Spine   Assume a hands and knees position on a firm surface. Keep your hands under your shoulders and your knees under your hips. You may place padding under your knees for  comfort.  Drop your head and point your tailbone toward the ground below you. This will round out your lower back like an angry cat. Hold this position for __________ seconds.  Slowly lift your head and release your tail bone so that your back sags into a large arch, like an old horse.  Hold this position for __________ seconds.  Repeat this until you feel limber in your low back.  Now, find your "sweet spot." This will be the most comfortable position somewhere between the two previous positions. This is your neutral spine. Once you have found this position, tense your stomach muscles to support your low back.  Hold this position for __________ seconds. Repeat __________ times. Complete this exercise __________ times per day.  STRENGTHENING EXERCISES - Low Back Sprain These exercises may help you when beginning to rehabilitate your injury. These exercises should be done near your "sweet spot." This is the neutral, low-back arch, somewhere between fully rounded and   fully arched, that is your least painful position. When performed in this safe range of motion, these exercises can be used for people who have either a flexion or extension based injury. These exercises may resolve your symptoms with or without further involvement from your physician, physical therapist or athletic trainer. While completing these exercises, remember:   Muscles can gain both the endurance and the strength needed for everyday activities through controlled exercises.  Complete these exercises as instructed by your physician, physical therapist or athletic trainer. Increase the resistance and repetitions only as guided.  You may experience muscle soreness or fatigue, but the pain or discomfort you are trying to eliminate should never worsen during these exercises. If this pain does worsen, stop and make certain you are following the directions exactly. If the pain is still present after adjustments, discontinue the  exercise until you can discuss the trouble with your caregiver. STRENGTHENING - Deep Abdominals, Pelvic Tilt   Lie on a firm bed or floor. Keeping your legs in front of you, bend your knees so they are both pointed toward the ceiling and your feet are flat on the floor.  Tense your lower abdominal muscles to press your low back into the floor. This motion will rotate your pelvis so that your tail bone is scooping upwards rather than pointing at your feet or into the floor. With a gentle tension and even breathing, hold this position for __________ seconds. Repeat __________ times. Complete this exercise __________ times per day.  STRENGTHENING - Abdominals, Crunches   Lie on a firm bed or floor. Keeping your legs in front of you, bend your knees so they are both pointed toward the ceiling and your feet are flat on the floor. Cross your arms over your chest.  Slightly tip your chin down without bending your neck.  Tense your abdominals and slowly lift your trunk high enough to just clear your shoulder blades. Lifting higher can put excessive stress on the lower back and does not further strengthen your abdominal muscles.  Control your return to the starting position. Repeat __________ times. Complete this exercise __________ times per day.  STRENGTHENING - Quadruped, Opposite UE/LE Lift   Assume a hands and knees position on a firm surface. Keep your hands under your shoulders and your knees under your hips. You may place padding under your knees for comfort.  Find your neutral spine and gently tense your abdominal muscles so that you can maintain this position. Your shoulders and hips should form a rectangle that is parallel with the floor and is not twisted.  Keeping your trunk steady, lift your right hand no higher than your shoulder and then your left leg no higher than your hip. Make sure you are not holding your breath. Hold this position for __________ seconds.  Continuing to keep  your abdominal muscles tense and your back steady, slowly return to your starting position. Repeat with the opposite arm and leg. Repeat __________ times. Complete this exercise __________ times per day.  STRENGTHENING - Abdominals and Quadriceps, Straight Leg Raise   Lie on a firm bed or floor with both legs extended in front of you.  Keeping one leg in contact with the floor, bend the other knee so that your foot can rest flat on the floor.  Find your neutral spine, and tense your abdominal muscles to maintain your spinal position throughout the exercise.  Slowly lift your straight leg off the floor about 6 inches for a count of   15, making sure to not hold your breath.  Still keeping your neutral spine, slowly lower your leg all the way to the floor. Repeat this exercise with each leg __________ times. Complete this exercise __________ times per day. POSTURE AND BODY MECHANICS CONSIDERATIONS - Low Back Sprain Keeping correct posture when sitting, standing or completing your activities will reduce the stress put on different body tissues, allowing injured tissues a chance to heal and limiting painful experiences. The following are general guidelines for improved posture. Your physician or physical therapist will provide you with any instructions specific to your needs. While reading these guidelines, remember:  The exercises prescribed by your provider will help you have the flexibility and strength to maintain correct postures.  The correct posture provides the best environment for your joints to work. All of your joints have less wear and tear when properly supported by a spine with good posture. This means you will experience a healthier, less painful body.  Correct posture must be practiced with all of your activities, especially prolonged sitting and standing. Correct posture is as important when doing repetitive low-stress activities (typing) as it is when doing a single heavy-load  activity (lifting). RESTING POSITIONS Consider which positions are most painful for you when choosing a resting position. If you have pain with flexion-based activities (sitting, bending, stooping, squatting), choose a position that allows you to rest in a less flexed posture. You would want to avoid curling into a fetal position on your side. If your pain worsens with extension-based activities (prolonged standing, working overhead), avoid resting in an extended position such as sleeping on your stomach. Most people will find more comfort when they rest with their spine in a more neutral position, neither too rounded nor too arched. Lying on a non-sagging bed on your side with a pillow between your knees, or on your back with a pillow under your knees will often provide some relief. Keep in mind, being in any one position for a prolonged period of time, no matter how correct your posture, can still lead to stiffness. PROPER SITTING POSTURE In order to minimize stress and discomfort on your spine, you must sit with correct posture. Sitting with good posture should be effortless for a healthy body. Returning to good posture is a gradual process. Many people can work toward this most comfortably by using various supports until they have the flexibility and strength to maintain this posture on their own. When sitting with proper posture, your ears will fall over your shoulders and your shoulders will fall over your hips. You should use the back of the chair to support your upper back. Your lower back will be in a neutral position, just slightly arched. You may place a small pillow or folded towel at the base of your lower back for  support.  When working at a desk, create an environment that supports good, upright posture. Without extra support, muscles tire, which leads to excessive strain on joints and other tissues. Keep these recommendations in mind: CHAIR:  A chair should be able to slide under your desk  when your back makes contact with the back of the chair. This allows you to work closely.  The chair's height should allow your eyes to be level with the upper part of your monitor and your hands to be slightly lower than your elbows. BODY POSITION  Your feet should make contact with the floor. If this is not possible, use a foot rest.  Keep your ears   over your shoulders. This will reduce stress on your neck and low back. INCORRECT SITTING POSTURES  If you are feeling tired and unable to assume a healthy sitting posture, do not slouch or slump. This puts excessive strain on your back tissues, causing more damage and pain. Healthier options include:  Using more support, like a lumbar pillow.  Switching tasks to something that requires you to be upright or walking.  Talking a brief walk.  Lying down to rest in a neutral-spine position. PROLONGED STANDING WHILE SLIGHTLY LEANING FORWARD  When completing a task that requires you to lean forward while standing in one place for a long time, place either foot up on a stationary 2-4 inch high object to help maintain the best posture. When both feet are on the ground, the lower back tends to lose its slight inward curve. If this curve flattens (or becomes too large), then the back and your other joints will experience too much stress, tire more quickly, and can cause pain. CORRECT STANDING POSTURES Proper standing posture should be assumed with all daily activities, even if they only take a few moments, like when brushing your teeth. As in sitting, your ears should fall over your shoulders and your shoulders should fall over your hips. You should keep a slight tension in your abdominal muscles to brace your spine. Your tailbone should point down to the ground, not behind your body, resulting in an over-extended swayback posture.  INCORRECT STANDING POSTURES  Common incorrect standing postures include a forward head, locked knees and/or an excessive  swayback. WALKING Walk with an upright posture. Your ears, shoulders and hips should all line-up. PROLONGED ACTIVITY IN A FLEXED POSITION When completing a task that requires you to bend forward at your waist or lean over a low surface, try to find a way to stabilize 3 out of 4 of your limbs. You can place a hand or elbow on your thigh or rest a knee on the surface you are reaching across. This will provide you more stability, so that your muscles do not tire as quickly. By keeping your knees relaxed, or slightly bent, you will also reduce stress across your lower back. CORRECT LIFTING TECHNIQUES DO :  Assume a wide stance. This will provide you more stability and the opportunity to get as close as possible to the object which you are lifting.  Tense your abdominals to brace your spine. Bend at the knees and hips. Keeping your back locked in a neutral-spine position, lift using your leg muscles. Lift with your legs, keeping your back straight.  Test the weight of unknown objects before attempting to lift them.  Try to keep your elbows locked down at your sides in order get the best strength from your shoulders when carrying an object.  Always ask for help when lifting heavy or awkward objects. INCORRECT LIFTING TECHNIQUES DO NOT:   Lock your knees when lifting, even if it is a small object.  Bend and twist. Pivot at your feet or move your feet when needing to change directions.  Assume that you can safely pick up even a paperclip without proper posture.   This information is not intended to replace advice given to you by your health care provider. Make sure you discuss any questions you have with your health care provider.   Document Released: 10/21/2005 Document Revised: 11/11/2014 Document Reviewed: 02/02/2009 [employer] Patient Education 2016 [employer]  

## 2016-07-23 NOTE — Progress Notes (Signed)
Subjective:    CC: Left low back pain  HPI:  About a week ago started having sharp left low back pain.   Says is started when he was twisting his upper body.  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  He had back surgery about a year and a half ago. I'm not sure what level of the spine this was at. Rates pain 10/10 when hits.  No problems with urination. Tried meloxicam and volteran gel.  Tried aleve as well.  He does use ice and that seems to help the most out of anything he's tried. He says he is really here because he is just worried about his kidneys. He wants to make sure that he is not having any problems SS father had a diagnosis of renal failure.  BP 128/80   Pulse 68   Ht 6\' 1"  (1.854 m)   Wt 225 lb (102.1 kg)   SpO2 94%   BMI 29.69 kg/m     Allergies  Allergen Reactions  . Avodart [Dutasteride] Other (See Comments)    Sexual dysfunction  . Viagra [Sildenafil Citrate] Nausea Only    Headaches    Past Medical History:  Diagnosis Date  . 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 HYPERTROPHY PROSTATE W/O UR OBST & OTH LUTS 01/11/2010   Qualifier: Diagnosis of  By: Linford Arnold MD, Santina Evans      Past Surgical History:  Procedure Laterality Date  . ANKLE SURGERY     Right   . BACK SURGERY  2008  . CARPAL TUNNEL RELEASE     right  . ELBOW SURGERY  2-09   Left and right  . KNEE SURGERY     right x 2  . KNEE SURGERY     Left   . PLANTAR FASCIA RELEASE     Right   . right rotator cuff surgery      Social History   Social History  . Marital status: Divorced    Spouse name: N/A  . Number of children: 2  . Years of education: N/A   Occupational History  . IT     [employer]   Social History Main Topics  . Smoking status: Never Smoker  . Smokeless tobacco: Not on file  . Alcohol use No  . Drug use: No  . Sexual activity: Not on file   Other Topics Concern  . Not on file   Social History Narrative   Working out every day of  The week.      Family History  Problem Relation Age of Onset  . Alcohol abuse Mother   . Diabetes Father   . Stroke Father   . Thyroid disease Father   . Other Father     back problems  . Kidney disease Father     Renal failure  . Cancer Other     Outpatient Encounter Prescriptions as of 07/23/2016  Medication Sig  . diclofenac sodium (VOLTAREN) 1 % GEL Apply 4 g topically 4 (four) times daily. On hand joints  . meloxicam (MOBIC) 15 MG tablet TAKE ONE TABLET BY MOUTH ONCE DAILY AS NEEDED FOR PAIN  . Tadalafil (CIALIS) 2.5 MG TABS TAKE ONE TABLET BY MOUTH ONCE DAILY FOR  BPH  . valACYclovir (VALTREX) 1000 MG tablet TAKE TWO TABLETS BY MOUTH TWICE DAILY  . predniSONE (DELTASONE) 20 MG tablet Take 2 tablets (40 mg total) by mouth daily.   No facility-administered encounter medications on file as of 07/23/2016.  Review of Systems: No fevers, chills, night sweats, weight loss, chest pain, or shortness of breath.   Objective:    General: Well Developed, well nourished, and in no acute distress.  Neuro: Alert and oriented x3, extra-ocular muscles intact, sensation grossly intact.  HEENT: Normocephalic, atraumatic  Skin: Warm and dry, no rashes. Cardiac: Regular rate and rhythm, no murmurs rubs or gallops, no lower extremity edema.  Respiratory: Clear to auscultation bilaterally. Not using accessory muscles, speaking in full sentences. MSK: Normal lumbar flexion extension rotation right and left. That he did have some discomfort with rotation to the left. Mild tenderness over the left lateral muscles below the ribs. Nontender over the lumbar or thoracic spines. Negative straight leg raise bilaterally. Hip, knee, ankle strength is 5 out of 5 bilaterally. Patellar reflexes 1+ bilaterally   Impression and Recommendations:    Left low back pain-likely muscular skeletal strain. Given a handout with stretches to do on his own. Continue with icing and anti-inflammatory as needed. To give him a  steroid burst to try to hip inflammation get it calmed down. Because of his concern about his renal function is actually been almost 2 years since we have checked his kidneys. We'll check CMP and urinalysis today.

## 2016-07-24 LAB — COMPLETE METABOLIC PANEL WITH GFR
ALBUMIN: 4.2 g/dL (ref 3.6–5.1)
ALK PHOS: 76 U/L (ref 40–115)
ALT: 18 U/L (ref 9–46)
AST: 22 U/L (ref 10–35)
BILIRUBIN TOTAL: 0.5 mg/dL (ref 0.2–1.2)
BUN: 22 mg/dL (ref 7–25)
CO2: 27 mmol/L (ref 20–31)
Calcium: 9.3 mg/dL (ref 8.6–10.3)
Chloride: 105 mmol/L (ref 98–110)
Creat: 1.13 mg/dL (ref 0.70–1.25)
GFR, EST AFRICAN AMERICAN: 81 mL/min (ref >=60)
GFR, EST NON AFRICAN AMERICAN: 70 mL/min (ref >=60)
Glucose, Bld: 104 mg/dL — ABNORMAL HIGH (ref 65–99)
POTASSIUM: 4.2 mmol/L (ref 3.5–5.3)
SODIUM: 140 mmol/L (ref 135–146)
TOTAL PROTEIN: 7 g/dL (ref 6.1–8.1)

## 2016-07-24 LAB — URINALYSIS
Bilirubin Urine: NEGATIVE
Glucose, UA: NEGATIVE
HGB URINE DIPSTICK: NEGATIVE
Ketones, ur: NEGATIVE
LEUKOCYTES UA: NEGATIVE
NITRITE: NEGATIVE
PROTEIN: NEGATIVE
Specific Gravity, Urine: 1.021 (ref 1.001–1.035)
pH: 7.5 (ref 5.0–8.0)

## 2016-08-27 ENCOUNTER — Telehealth: Payer: Self-pay

## 2016-08-27 NOTE — Telephone Encounter (Signed)
Steve Maxwell states he is having severe pain in both hands from arthritis. The Voltaren gel and meloxicam is not helping with the knuckle pain. Is there something else he could try?

## 2016-08-28 NOTE — Telephone Encounter (Signed)
We could try getting him in with sports med.

## 2016-08-28 NOTE — Telephone Encounter (Signed)
Pt scheduled with Dr. TKarie Schwalbe

## 2016-08-30 ENCOUNTER — Encounter: Payer: Self-pay | Admitting: Sports Medicine

## 2016-08-30 ENCOUNTER — Ambulatory Visit (INDEPENDENT_AMBULATORY_CARE_PROVIDER_SITE_OTHER): Payer: Managed Care, Other (non HMO) | Admitting: Sports Medicine

## 2016-08-30 DIAGNOSIS — M19042 Primary osteoarthritis, left hand: Secondary | ICD-10-CM

## 2016-08-30 DIAGNOSIS — M19041 Primary osteoarthritis, right hand: Secondary | ICD-10-CM | POA: Diagnosis not present

## 2016-08-30 NOTE — Assessment & Plan Note (Addendum)
I haven't seen this pleasant 60 year old male for hand osteoarthritis in over 3 years. Unfortunately he has not had sufficient responses to topical diclofenac, Celebrex, meloxicam. 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 We injected his left and right second and fourth DIP joints. Return in one month, I can do his other joints if good response and persistent symptoms.

## 2016-08-30 NOTE — Progress Notes (Signed)
Subjective:    I'm seeing this patient as a consultation for:  Dr. Nani Steve Maxwell  CC: Hand pain  HPI: This is a pleasant 60 year old male, he has known bilateral hand osteoarthritis, I have not seen him in over 3 years. He's tried multiple NSAIDs, unfortunately has continued to have pain. Pain is localized predominantly at the distal interphalangeal joints of fingers 2 through 5 on both  hands. Severe, persistent  Past medical history:  Negative.  See flowsheet/record as well for more information.  Surgical history: Negative.  See flowsheet/record as well for more information.  Family history: Negative.  See flowsheet/record as well for more information.  Social history: Negative.  See flowsheet/record as well for more information.  Allergies, and medications have been entered into the medical record, reviewed, and no changes needed.   Review of Systems: No headache, visual changes, nausea, vomiting, diarrhea, constipation, dizziness, abdominal pain, skin rash, fevers, chills, night sweats, weight loss, swollen lymph nodes, body aches, joint swelling, muscle aches, chest pain, shortness of breath, mood changes, visual or auditory hallucinations.   Objective:   General: Well Developed, well nourished, and in no acute distress.  Neuro/Psych: Alert and oriented x3, extra-ocular muscles intact, able to move all 4 extremities, sensation grossly intact. Skin: Warm and dry, no rashes noted.  Respiratory: Not using accessory muscles, speaking in full sentences, trachea midline.  Cardiovascular: Pulses palpable, no extremity edema. Abdomen: Does not appear distended. Hands: Bilateral enlarged Heberden nodes on fingers 2 through 5 bilaterally. Tenderness to palpation at all of these.  Procedure: Real-time Ultrasound Guided Injection of right second DIP Device: GE Logiq E  Verbal informed consent obtained.  Time-out conducted.  Noted no overlying erythema, induration, or other signs of  local infection.  Skin prepped in a sterile fashion.  Local anesthesia: Topical Ethyl chloride.  With sterile technique and under real time ultrasound guidance:  1/4 mL kenalog 40, 1/4 mL lidocaine injected easily Completed without difficulty  Pain immediately resolved suggesting accurate placement of the medication.  Advised to call if fevers/chills, erythema, induration, drainage, or persistent bleeding.  Images permanently stored and available for review in the ultrasound unit.  Impression: Technically successful ultrasound guided injection.  Procedure: Real-time Ultrasound Guided Injection of right fourth DIP Device: GE Logiq E  Verbal informed consent obtained.  Time-out conducted.  Noted no overlying erythema, induration, or other signs of local infection.  Skin prepped in a sterile fashion.  Local anesthesia: Topical Ethyl chloride.  With sterile technique and under real time ultrasound guidance:  1/4 mL kenalog 40, 1/4 mL lidocaine injected easily Completed without difficulty  Pain immediately resolved suggesting accurate placement of the medication.  Advised to call if fevers/chills, erythema, induration, drainage, or persistent bleeding.  Images permanently stored and available for review in the ultrasound unit.  Impression: Technically successful ultrasound guided injection.  Procedure: Real-time Ultrasound Guided Injection of left second DIP Device: GE Logiq E  Verbal informed consent obtained.  Time-out conducted.  Noted no overlying erythema, induration, or other signs of local infection.  Skin prepped in a sterile fashion.  Local anesthesia: Topical Ethyl chloride.  With sterile technique and under real time ultrasound guidance:  1/4 mL kenalog 40, 1/4 mL lidocaine injected easily Completed without difficulty  Pain immediately resolved suggesting accurate placement of the medication.  Advised to call if fevers/chills, erythema, induration, drainage, or persistent  bleeding.  Images permanently stored and available for review in the ultrasound unit.  Impression: Technically successful ultrasound guided  injection.  Procedure: Real-time Ultrasound Guided Injection of left fourth DIP Device: GE Logiq E  Verbal informed consent obtained.  Time-out conducted.  Noted no overlying erythema, induration, or other signs of local infection.  Skin prepped in a sterile fashion.  Local anesthesia: Topical Ethyl chloride.  With sterile technique and under real time ultrasound guidance:  1/4 mL kenalog 40, 1/4 mL lidocaine injected easily Completed without difficulty  Pain immediately resolved suggesting accurate placement of the medication.  Advised to call if fevers/chills, erythema, induration, drainage, or persistent bleeding.  Images permanently stored and available for review in the ultrasound unit.  Impression: Technically successful ultrasound guided injection.  Impression and Recommendations:   This case required medical decision making of moderate complexity.  Osteoarthritis, bilateral hands I haven't seen this pleasant 60 year old male for hand osteoarthritis in over 3 years. Unfortunately he has not had sufficient responses to topical diclofenac, Celebrex, meloxicam. 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 We injected his left and right second and fourth DIP joints. Return in one month, I can do his other joints if good response and persistent symptoms.

## 2016-09-05 ENCOUNTER — Telehealth: Payer: Self-pay

## 2016-09-05 NOTE — Telephone Encounter (Signed)
Let us give it a couple of weeks before we inject the other joints.

## 2016-09-05 NOTE — Telephone Encounter (Signed)
Pt.notified

## 2016-09-05 NOTE — Telephone Encounter (Signed)
Pt left VM stating joint injections are working great. Would like to know how long he should wait before he gets his other joints injected. Please advise.

## 2016-09-19 ENCOUNTER — Encounter: Payer: Self-pay | Admitting: Sports Medicine

## 2016-09-20 NOTE — Telephone Encounter (Signed)
Diclofenac will not be approved by insurance

## 2016-09-20 NOTE — Telephone Encounter (Signed)
Message left on vm rx is $30 and [email or web address]

## 2016-10-04 ENCOUNTER — Ambulatory Visit (INDEPENDENT_AMBULATORY_CARE_PROVIDER_SITE_OTHER): Payer: Managed Care, Other (non HMO) | Admitting: Sports Medicine

## 2016-10-04 ENCOUNTER — Encounter: Payer: Self-pay | Admitting: Sports Medicine

## 2016-10-04 DIAGNOSIS — M19042 Primary osteoarthritis, left hand: Secondary | ICD-10-CM | POA: Diagnosis not present

## 2016-10-04 DIAGNOSIS — M5136 Other intervertebral disc degeneration, lumbar region: Secondary | ICD-10-CM

## 2016-10-04 DIAGNOSIS — M51369 Other intervertebral disc degeneration, lumbar region without mention of lumbar back pain or lower extremity pain: Secondary | ICD-10-CM

## 2016-10-04 DIAGNOSIS — M19041 Primary osteoarthritis, right hand: Secondary | ICD-10-CM | POA: Diagnosis not present

## 2016-10-04 MED ORDER — KETOROLAC TROMETHAMINE 30 MG/ML IJ SOLN
30.0000 mg | Freq: Once | INTRAMUSCULAR | Status: AC
  Administered 2016-10-04: 30 mg via INTRAMUSCULAR

## 2016-10-04 MED ORDER — METHYLPREDNISOLONE SODIUM SUCC 125 MG IJ SOLR
125.0000 mg | Freq: Once | INTRAMUSCULAR | Status: AC
  Administered 2016-10-04: 125 mg via INTRAMUSCULAR

## 2016-10-04 NOTE — Addendum Note (Signed)
Addended by: Baird KayUGLAS, Aly Seidenberg M on: 10/04/2016 02:34 PM   Modules accepted: Orders

## 2016-10-04 NOTE — Progress Notes (Addendum)
  Subjective:    CC: Follow-up  HPI:  Finger pain: We injected his second DIPs, had a great response, now having pain and desires repeat injections at the third DIPs. Pain is moderate, persistent without radiation.  Low back pain: With known degenerative disc disease, post L5-S1 fusion, he had several epidurals beforehand, now he has adjacent level disease at the L4-L5 level with left L4 foraminal stenosis. Has had several epidurals at the L3-L4 level and L3-L4 facet injection without much relief.  Past medical history:  Negative.  See flowsheet/record as well for more information.  Surgical history: Negative.  See flowsheet/record as well for more information.  Family history: Negative.  See flowsheet/record as well for more information.  Social history: Negative.  See flowsheet/record as well for more information.  Allergies, and medications have been entered into the medical record, reviewed, and no changes needed.   Review of Systems: No fevers, chills, night sweats, weight loss, chest pain, or shortness of breath.   Objective:    General: Well Developed, well nourished, and in no acute distress.  Neuro: Alert and oriented x3, extra-ocular muscles intact, sensation grossly intact.  HEENT: Normocephalic, atraumatic, pupils equal round reactive to light, neck supple, no masses, no lymphadenopathy, thyroid nonpalpable.  Skin: Warm and dry, no rashes. Cardiac: Regular rate and rhythm, no murmurs rubs or gallops, no lower extremity edema.  Respiratory: Clear to auscultation bilaterally. Not using accessory muscles, speaking in full sentences.  Procedure: Real-time Ultrasound Guided Injection of left third DIP Device: GE Logiq E  Verbal informed consent obtained.  Time-out conducted.  Noted no overlying erythema, induration, or other signs of local infection.  Skin prepped in a sterile fashion.  Local anesthesia: Topical Ethyl chloride.  With sterile technique and under real time  ultrasound guidance:  1/2 mL kenalog 40, 1/2 mL lidocaine injected easily Completed without difficulty  Pain immediately resolved suggesting accurate placement of the medication.  Advised to call if fevers/chills, erythema, induration, drainage, or persistent bleeding.  Images permanently stored and available for review in the ultrasound unit.  Impression: Technically successful ultrasound guided injection.  Procedure: Real-time Ultrasound Guided Injection of right third DIP Device: GE Logiq E  Verbal informed consent obtained.  Time-out conducted.  Noted no overlying erythema, induration, or other signs of local infection.  Skin prepped in a sterile fashion.  Local anesthesia: Topical Ethyl chloride.  With sterile technique and under real time ultrasound guidance:  1/2 mL kenalog 40, 1/2 mL lidocaine injected easily Completed without difficulty  Pain immediately resolved suggesting accurate placement of the medication.  Advised to call if fevers/chills, erythema, induration, drainage, or persistent bleeding.  Images permanently stored and available for review in the ultrasound unit.  Impression: Technically successful ultrasound guided injection.  Impression and Recommendations:    Lumbar degenerative disc disease Left L4-L5 transforaminal epidural. He will also set himself up with Dr. Yevette Edwardsumonski for a second opinion.   Osteoarthritis, bilateral hands Bilateral third distal interphalangeal joint injections   I spent 40 minutes with this patient, greater than 50% was face-to-face time counseling regarding the above diagnoses, this was separate from the time spent performing the above procedure

## 2016-10-04 NOTE — Assessment & Plan Note (Signed)
Bilateral third distal interphalangeal joint injections

## 2016-10-04 NOTE — Assessment & Plan Note (Signed)
Left L4-L5 transforaminal epidural. He will also set himself up with Dr. Yevette Edwardsumonski for a second opinion.

## 2016-10-09 ENCOUNTER — Ambulatory Visit
Admission: RE | Admit: 2016-10-09 | Discharge: 2016-10-09 | Disposition: A | Discharge status: Home / Self Care | Payer: Managed Care, Other (non HMO) | Source: Ambulatory Visit | Attending: Sports Medicine | Admitting: Sports Medicine

## 2016-10-09 VITALS — BP 168/112 | HR 66

## 2016-10-09 DIAGNOSIS — M5136 Other intervertebral disc degeneration, lumbar region: Secondary | ICD-10-CM

## 2016-10-09 MED ORDER — METHYLPREDNISOLONE ACETATE 40 MG/ML INJ SUSP (RADIOLOG
120.0000 mg | Freq: Once | INTRAMUSCULAR | Status: AC
  Administered 2016-10-09: 120 mg via EPIDURAL

## 2016-10-09 MED ORDER — IOPAMIDOL (ISOVUE-M 200) INJECTION 41%
1.0000 mL | Freq: Once | INTRAMUSCULAR | Status: AC
  Administered 2016-10-09: 1 mL via EPIDURAL

## 2016-10-09 NOTE — Discharge Instructions (Signed)

## 2016-10-15 ENCOUNTER — Telehealth: Payer: Self-pay

## 2016-10-15 DIAGNOSIS — M5416 Radiculopathy, lumbar region: Secondary | ICD-10-CM

## 2016-10-15 NOTE — Telephone Encounter (Signed)
I assume he means the epidural? If he would like I can place the order for the second epidural in the series.

## 2016-10-15 NOTE — Telephone Encounter (Signed)
Pt states he had injections 1 week ago and he received 8-9/10 relief of pain but he is now starting to feel some pains/tingling coming back with prolonged standing and would like to know what is the next step.

## 2016-10-16 NOTE — Telephone Encounter (Signed)
Pt would like to have second epidural ordered.

## 2016-10-29 ENCOUNTER — Ambulatory Visit
Admission: RE | Admit: 2016-10-29 | Discharge: 2016-10-29 | Disposition: A | Discharge status: Home / Self Care | Payer: Managed Care, Other (non HMO) | Source: Ambulatory Visit | Attending: Sports Medicine | Admitting: Sports Medicine

## 2016-10-29 VITALS — BP 170/98 | HR 72

## 2016-10-29 DIAGNOSIS — M5136 Other intervertebral disc degeneration, lumbar region: Secondary | ICD-10-CM

## 2016-10-29 MED ORDER — METHYLPREDNISOLONE ACETATE 40 MG/ML INJ SUSP (RADIOLOG
120.0000 mg | Freq: Once | INTRAMUSCULAR | Status: AC
Start: 2016-10-29 — End: 2016-10-29
  Administered 2016-10-29: 120 mg via EPIDURAL

## 2016-10-29 MED ORDER — IOPAMIDOL (ISOVUE-M 200) INJECTION 41%
1.0000 mL | Freq: Once | INTRAMUSCULAR | Status: AC
  Administered 2016-10-29: 1 mL via EPIDURAL

## 2016-11-01 ENCOUNTER — Other Ambulatory Visit: Payer: Self-pay | Admitting: Family Medicine

## 2016-11-19 ENCOUNTER — Ambulatory Visit (INDEPENDENT_AMBULATORY_CARE_PROVIDER_SITE_OTHER): Payer: Managed Care, Other (non HMO) | Admitting: Family Medicine

## 2016-11-19 ENCOUNTER — Encounter: Payer: Self-pay | Admitting: Family Medicine

## 2016-11-19 VITALS — BP 144/92 | HR 84 | Temp 98.1°F | Wt 238.0 lb

## 2016-11-19 DIAGNOSIS — J0110 Acute frontal sinusitis, unspecified: Secondary | ICD-10-CM

## 2016-11-19 MED ORDER — AZITHROMYCIN 250 MG PO TABS: ORAL_TABLET | ORAL | 0 refills | Status: AC

## 2016-11-19 MED ORDER — HYDROCOD POLST-CPM POLST ER 10-8 MG/5ML PO SUER: 5.0000 mL | Freq: Every evening | ORAL | 0 refills | Status: DC | PRN

## 2016-11-19 NOTE — Progress Notes (Signed)
   Subjective:    Patient ID: Steve Maxwell, male    DOB: [birth year], 61 y.o.   MRN: [id number]  HPI 61 year old male otherwise healthy comes in today with one week of cough and nasal congestion. He says it initially started with a cough and eventually moved up into his sinuses. He's had a frontal headache for several days. No fevers chills or sweats. Mild sore throat. No ear pain. He's been taking Robitussin and Mucinex without significant relief. He says last night he barely slept at all because the cough kept him awake.   Review of Systems     Objective:   Physical Exam  Constitutional: He is oriented to person, place, and time. He appears well-developed and well-nourished.  HENT:  Head: Normocephalic and atraumatic.  Right Ear: External ear normal.  Left Ear: External ear normal.  Nose: Nose normal.  Mouth/Throat: Oropharynx is clear and moist.  TMs and canals are clear.   Eyes: Conjunctivae and EOM are normal. Pupils are equal, round, and reactive to light.  Neck: Neck supple. No thyromegaly present.  Cardiovascular: Normal rate and normal heart sounds.   Pulmonary/Chest: Effort normal and breath sounds normal.  Lymphadenopathy:    He has no cervical adenopathy.  Neurological: He is alert and oriented to person, place, and time.  Skin: Skin is warm and dry.  Psychiatric: He has a normal mood and affect.        Assessment & Plan:  Acute sinusitis/bronchitis-we'll go ahead and treat with that her azithromycin. If not significantly better in one week and please let me know. Okay to continue with symptom Medicare. Make sure hydrating well..Marland Kitchen

## 2016-11-19 NOTE — Patient Instructions (Addendum)

## 2016-12-09 ENCOUNTER — Telehealth: Payer: Self-pay | Admitting: Family Medicine

## 2016-12-09 NOTE — Telephone Encounter (Signed)
Patient called back returning your call. Thanks °

## 2016-12-17 ENCOUNTER — Telehealth: Payer: Self-pay | Admitting: Family Medicine

## 2016-12-17 NOTE — Telephone Encounter (Signed)
Can you call him please

## 2016-12-17 NOTE — Telephone Encounter (Signed)
Patient has asked me send a note for you to call him on his cell phone.  Thanks

## 2016-12-17 NOTE — Telephone Encounter (Signed)
lvm asking pt to rtn call.Steve Maxwell  

## 2016-12-18 NOTE — Telephone Encounter (Signed)
Pt reports that he is concerned about having cramps for no reason. He stated that he does drink plenty of fluids especially after he works out. He stated that his dad suffered from this issue also. He stated that he can be driving or in the bed and the cramping will just start. He does not use a lot if any salt.   Pt has an appt with Dr. Benjamin Stainhekkekandam tomorrow at 4 pm. Will fwd to pcp for advice. He last had a cmp done 07/2016 which was nl. Glu was 104.   Laureen Ochs.Amaal Dimartino, Viann Shoveonya Lynetta

## 2016-12-19 ENCOUNTER — Ambulatory Visit (INDEPENDENT_AMBULATORY_CARE_PROVIDER_SITE_OTHER): Payer: Managed Care, Other (non HMO) | Admitting: Sports Medicine

## 2016-12-19 DIAGNOSIS — M19042 Primary osteoarthritis, left hand: Secondary | ICD-10-CM

## 2016-12-19 DIAGNOSIS — M19041 Primary osteoarthritis, right hand: Secondary | ICD-10-CM | POA: Diagnosis not present

## 2016-12-19 MED ORDER — COLCHICINE 0.6 MG PO CAPS: 1.0000 | ORAL_CAPSULE | Freq: Every day | ORAL | 3 refills | Status: DC

## 2016-12-19 NOTE — Progress Notes (Signed)
Subjective:    CC: Follow-up  HPI: Hand osteoarthritis: Fantastic relief with injections 4 months ago, desires repeat injections into the left and right second, third, and fourth distal interphalangeal joints. Pain is moderate, persistent, localized without radiation. Meloxicam has really not been effective.  Past medical history:  Negative.  See flowsheet/record as well for more information.  Surgical history: Negative.  See flowsheet/record as well for more information.  Family history: Negative.  See flowsheet/record as well for more information.  Social history: Negative.  See flowsheet/record as well for more information.  Allergies, and medications have been entered into the medical record, reviewed, and no changes needed.   Review of Systems: No fevers, chills, night sweats, weight loss, chest pain, or shortness of breath.   Objective:    General: Well Developed, well nourished, and in no acute distress.  Neuro: Alert and oriented x3, extra-ocular muscles intact, sensation grossly intact.  HEENT: Normocephalic, atraumatic, pupils equal round reactive to light, neck supple, no masses, no lymphadenopathy, thyroid nonpalpable.  Skin: Warm and dry, no rashes. Cardiac: Regular rate and rhythm, no murmurs rubs or gallops, no lower extremity edema.  Respiratory: Clear to auscultation bilaterally. Not using accessory muscles, speaking in full sentences.  Procedure: Real-time Ultrasound Guided Injection of right second DIP Device: GE Logiq E  Verbal informed consent obtained.  Time-out conducted.  Noted no overlying erythema, induration, or other signs of local infection.  Skin prepped in a sterile fashion.  Local anesthesia: Topical Ethyl chloride.  With sterile technique and under real time ultrasound guidance:  1/3 mL kenalog 40, 1/4 mL lidocaine injected easily Completed without difficulty  Pain immediately resolved suggesting accurate placement of the medication.  Advised to  call if fevers/chills, erythema, induration, drainage, or persistent bleeding.  Images permanently stored and available for review in the ultrasound unit.  Impression: Technically successful ultrasound guided injection.  Procedure: Real-time Ultrasound Guided Injection of right fourth DIP Device: GE Logiq E  Verbal informed consent obtained.  Time-out conducted.  Noted no overlying erythema, induration, or other signs of local infection.  Skin prepped in a sterile fashion.  Local anesthesia: Topical Ethyl chloride.  With sterile technique and under real time ultrasound guidance:  1/3 mL kenalog 40, 1/4 mL lidocaine injected easily Completed without difficulty  Pain immediately resolved suggesting accurate placement of the medication.  Advised to call if fevers/chills, erythema, induration, drainage, or persistent bleeding.  Images permanently stored and available for review in the ultrasound unit.  Impression: Technically successful ultrasound guided injection.  Procedure: Real-time Ultrasound Guided Injection of left second DIP Device: GE Logiq E  Verbal informed consent obtained.  Time-out conducted.  Noted no overlying erythema, induration, or other signs of local infection.  Skin prepped in a sterile fashion.  Local anesthesia: Topical Ethyl chloride.  With sterile technique and under real time ultrasound guidance:  1/3 mL kenalog 40, 1/4 mL lidocaine injected easily Completed without difficulty  Pain immediately resolved suggesting accurate placement of the medication.  Advised to call if fevers/chills, erythema, induration, drainage, or persistent bleeding.  Images permanently stored and available for review in the ultrasound unit.  Impression: Technically successful ultrasound guided injection.  Procedure: Real-time Ultrasound Guided Injection of left fourth DIP Device: GE Logiq E  Verbal informed consent obtained.  Time-out conducted.  Noted no overlying erythema,  induration, or other signs of local infection.  Skin prepped in a sterile fashion.  Local anesthesia: Topical Ethyl chloride.  With sterile technique and under real time  ultrasound guidance:  1/3 mL kenalog 40, 1/4 mL lidocaine injected easily Completed without difficulty  Pain immediately resolved suggesting accurate placement of the medication.  Advised to call if fevers/chills, erythema, induration, drainage, or persistent bleeding.  Images permanently stored and available for review in the ultrasound unit.  Impression: Technically successful ultrasound guided injection.  Procedure: Real-time Ultrasound Guided Injection of right third DIP Device: GE Logiq E  Verbal informed consent obtained.  Time-out conducted.  Noted no overlying erythema, induration, or other signs of local infection.  Skin prepped in a sterile fashion.  Local anesthesia: Topical Ethyl chloride.  With sterile technique and under real time ultrasound guidance:  1/3 mL kenalog 40, 1/4 mL lidocaine injected easily Completed without difficulty  Pain immediately resolved suggesting accurate placement of the medication.  Advised to call if fevers/chills, erythema, induration, drainage, or persistent bleeding.  Images permanently stored and available for review in the ultrasound unit.  Impression: Technically successful ultrasound guided injection.  Procedure: Real-time Ultrasound Guided Injection of left third DIP Device: GE Logiq E  Verbal informed consent obtained.  Time-out conducted.  Noted no overlying erythema, induration, or other signs of local infection.  Skin prepped in a sterile fashion.  Local anesthesia: Topical Ethyl chloride.  With sterile technique and under real time ultrasound guidance:  1/3 mL kenalog 40, 1/4 mL lidocaine injected easily Completed without difficulty  Pain immediately resolved suggesting accurate placement of the medication.  Advised to call if fevers/chills, erythema, induration,  drainage, or persistent bleeding.  Images permanently stored and available for review in the ultrasound unit.  Impression: Technically successful ultrasound guided injection.  Impression and Recommendations:    Osteoarthritis, bilateral hands Bilateral second, third, and fourth distal interphalangeal joint injections. Previous set of injections were approximately 4 months ago.

## 2016-12-19 NOTE — Assessment & Plan Note (Addendum)
Bilateral second, third, and fourth distal interphalangeal joint injections. Previous set of injections were approximately 4 months ago. Meloxicam is really not effective, discontinue this and switch to colchicine 1-2 tabs daily.

## 2016-12-19 NOTE — Telephone Encounter (Signed)
Pt given recommendations.[name]  

## 2016-12-19 NOTE — Telephone Encounter (Signed)
Work on hydrateion, stretching, good supportive shoes wear. Work on increased mobility of hamstring by touching toes, etc.  There are medication tx options but ill need appt to discuss.

## 2017-03-13 ENCOUNTER — Ambulatory Visit (INDEPENDENT_AMBULATORY_CARE_PROVIDER_SITE_OTHER): Payer: 59 | Admitting: Sports Medicine

## 2017-03-13 DIAGNOSIS — M19042 Primary osteoarthritis, left hand: Secondary | ICD-10-CM

## 2017-03-13 DIAGNOSIS — M19041 Primary osteoarthritis, right hand: Secondary | ICD-10-CM | POA: Diagnosis not present

## 2017-03-13 NOTE — Progress Notes (Signed)
Subjective:    CC:  Bilateral hand pain  HPI: This is a pleasant 61 year old male, here for hand osteoarthritis, we did injection 3 months ago and he did well, desires repeat. Pain is moderate, persistent, localized without radiation.  Past medical history:  Negative.  See flowsheet/record as well for more information.  Surgical history: Negative.  See flowsheet/record as well for more information.  Family history: Negative.  See flowsheet/record as well for more information.  Social history: Negative.  See flowsheet/record as well for more information.  Allergies, and medications have been entered into the medical record, reviewed, and no changes needed.   Review of Systems: No fevers, chills, night sweats, weight loss, chest pain, or shortness of breath.   Objective:    General: Well Developed, well nourished, and in no acute distress.  Neuro: Alert and oriented x3, extra-ocular muscles intact, sensation grossly intact.  HEENT: Normocephalic, atraumatic, pupils equal round reactive to light, neck supple, no masses, no lymphadenopathy, thyroid nonpalpable.  Skin: Warm and dry, no rashes. Cardiac: Regular rate and rhythm, no murmurs rubs or gallops, no lower extremity edema.  Respiratory: Clear to auscultation bilaterally. Not using accessory muscles, speaking in full sentences.  Procedure: Real-time Ultrasound Guided Injection of right second DIP Device: GE Logiq E  Verbal informed consent obtained.  Time-out conducted.  Noted no overlying erythema, induration, or other signs of local infection.  Skin prepped in a sterile fashion.  Local anesthesia: Topical Ethyl chloride.  With sterile technique and under real time ultrasound guidance:  1/3 mL kenalog 40, 1/4 mL lidocaine injected easily Completed without difficulty  Pain immediately resolved suggesting accurate placement of the medication.  Advised to call if fevers/chills, erythema, induration, drainage, or persistent  bleeding.  Images permanently stored and available for review in the ultrasound unit.  Impression: Technically successful ultrasound guided injection.  Procedure: Real-time Ultrasound Guided Injection of right fourth DIP Device: GE Logiq E  Verbal informed consent obtained.  Time-out conducted.  Noted no overlying erythema, induration, or other signs of local infection.  Skin prepped in a sterile fashion.  Local anesthesia: Topical Ethyl chloride.  With sterile technique and under real time ultrasound guidance:  1/3 mL kenalog 40, 1/4 mL lidocaine injected easily Completed without difficulty  Pain immediately resolved suggesting accurate placement of the medication.  Advised to call if fevers/chills, erythema, induration, drainage, or persistent bleeding.  Images permanently stored and available for review in the ultrasound unit.  Impression: Technically successful ultrasound guided injection.  Procedure: Real-time Ultrasound Guided Injection of left second DIP Device: GE Logiq E  Verbal informed consent obtained.  Time-out conducted.  Noted no overlying erythema, induration, or other signs of local infection.  Skin prepped in a sterile fashion.  Local anesthesia: Topical Ethyl chloride.  With sterile technique and under real time ultrasound guidance:  1/3 mL kenalog 40, 1/4 mL lidocaine injected easily Completed without difficulty  Pain immediately resolved suggesting accurate placement of the medication.  Advised to call if fevers/chills, erythema, induration, drainage, or persistent bleeding.  Images permanently stored and available for review in the ultrasound unit.  Impression: Technically successful ultrasound guided injection.  Procedure: Real-time Ultrasound Guided Injection of left fourth DIP Device: GE Logiq E  Verbal informed consent obtained.  Time-out conducted.  Noted no overlying erythema, induration, or other signs of local infection.  Skin prepped in a sterile  fashion.  Local anesthesia: Topical Ethyl chloride.  With sterile technique and under real time ultrasound guidance:  1/3 mL  kenalog 40, 1/4 mL lidocaine injected easily Completed without difficulty  Pain immediately resolved suggesting accurate placement of the medication.  Advised to call if fevers/chills, erythema, induration, drainage, or persistent bleeding.  Images permanently stored and available for review in the ultrasound unit.  Impression: Technically successful ultrasound guided injection.  Procedure: Real-time Ultrasound Guided Injection of right third DIP Device: GE Logiq E  Verbal informed consent obtained.  Time-out conducted.  Noted no overlying erythema, induration, or other signs of local infection.  Skin prepped in a sterile fashion.  Local anesthesia: Topical Ethyl chloride.  With sterile technique and under real time ultrasound guidance:  1/3 mL kenalog 40, 1/4 mL lidocaine injected easily Completed without difficulty  Pain immediately resolved suggesting accurate placement of the medication.  Advised to call if fevers/chills, erythema, induration, drainage, or persistent bleeding.  Images permanently stored and available for review in the ultrasound unit.  Impression: Technically successful ultrasound guided injection.  Procedure: Real-time Ultrasound Guided Injection of left third DIP Device: GE Logiq E  Verbal informed consent obtained.  Time-out conducted.  Noted no overlying erythema, induration, or other signs of local infection.  Skin prepped in a sterile fashion.  Local anesthesia: Topical Ethyl chloride.  With sterile technique and under real time ultrasound guidance:  1/3 mL kenalog 40, 1/4 mL lidocaine injected easily Completed without difficulty  Pain immediately resolved suggesting accurate placement of the medication.  Advised to call if fevers/chills, erythema, induration, drainage, or persistent bleeding.  Images permanently stored and available  for review in the ultrasound unit.  Impression: Technically successful ultrasound guided injection.  Impression and Recommendations:    Osteoarthritis, bilateral hands Bilateral second, third, and fourth distal interphalangeal joint injections. Previous set of injections were approximately 3 months ago. Meloxicam is really not effective, discontinue this and switch to colchicine 1-2 tabs daily. Also injections are becoming more difficult technically, and not as successful clinically. I think he is heading towards DIP fusion.

## 2017-03-13 NOTE — Assessment & Plan Note (Signed)
Bilateral second, third, and fourth distal interphalangeal joint injections. Previous set of injections were approximately 3 months ago. Meloxicam is really not effective, discontinue this and switch to colchicine 1-2 tabs daily. Also injections are becoming more difficult technically, and not as successful clinically. I think he is heading towards DIP fusion.

## 2017-04-07 ENCOUNTER — Ambulatory Visit (INDEPENDENT_AMBULATORY_CARE_PROVIDER_SITE_OTHER): Payer: 59 | Admitting: Family Medicine

## 2017-04-07 VITALS — BP 125/87 | HR 97 | Ht 73.0 in | Wt 218.8 lb

## 2017-04-07 DIAGNOSIS — Z111 Encounter for screening for respiratory tuberculosis: Secondary | ICD-10-CM | POA: Diagnosis not present

## 2017-04-07 NOTE — Progress Notes (Signed)
   Subjective:    Patient ID: Steve Maxwell, male    DOB: [birth year], 61 y.o.   MRN: [id number]  HPI Pt is here for PPD Placement.     Review of Systems     Objective:   Physical Exam        Assessment & Plan:  Pt Tolerated injection in left arm well and without complications. Pt advised to return to office for reading in 48-72 hours.

## 2017-04-07 NOTE — Progress Notes (Signed)
Agree with above.  Corrina Steffensen, MD  

## 2017-04-09 ENCOUNTER — Ambulatory Visit (INDEPENDENT_AMBULATORY_CARE_PROVIDER_SITE_OTHER): Payer: 59 | Admitting: Physician Assistant

## 2017-04-09 ENCOUNTER — Telehealth: Payer: Self-pay | Admitting: *Deleted

## 2017-04-09 VITALS — BP 138/85 | HR 96

## 2017-04-09 DIAGNOSIS — Z111 Encounter for screening for respiratory tuberculosis: Secondary | ICD-10-CM | POA: Diagnosis not present

## 2017-04-09 LAB — TB SKIN TEST
Induration: 0 mm
TB Skin Test: NEGATIVE

## 2017-04-09 NOTE — Telephone Encounter (Signed)
Pt stated that when he works out his hands, forearms and sometimes his legs cramp and spasm more than they have in the past. He states that he drinks plenty of water and gatoraid. He states that sometimes he will be lying in bed and the cramps will wake him up they are so bad. He wanted to know if there is anything that Dr. Linford ArnoldMetheney can suggest for him to take either OTC or prescribed.   His last CMP was normal (K=4.2).Loralee PacasBarkley, Nour Rodrigues SalleyLynetta

## 2017-04-09 NOTE — Progress Notes (Signed)
Pt came into clinic today for PPD read. Test was negative. Form was completed. No further questions.   Agree with above plan. Tandy GawJade Breeback PA-C

## 2017-04-09 NOTE — Telephone Encounter (Signed)
We could certainly consider retesting electrolytes since the last one was about 8 months ago that was normal. He also needs a repeat kidney function anyway. We can also check a muscle enzyme, CK just to make sure that is not an elevation in that which could be a sign that there is excess muscle breakdown causing the symptoms. I really don't think his medicines should be causing the cramping. Itching really well can help. We can also sometimes use prescription medication such as diltiazem which is a type of blood pressure pill. Some adults find some improvement with taking vitamin B6, 30 mg once a day. Certainly she wanted to try that over-the-counter for a month first and if not improving them please schedule an appointment.

## 2017-04-11 NOTE — Telephone Encounter (Signed)
Pt given recommendations.Steve Maxwell  

## 2017-06-03 ENCOUNTER — Telehealth: Payer: Self-pay

## 2017-06-03 DIAGNOSIS — M51369 Other intervertebral disc degeneration, lumbar region without mention of lumbar back pain or lower extremity pain: Secondary | ICD-10-CM

## 2017-06-03 DIAGNOSIS — M5136 Other intervertebral disc degeneration, lumbar region: Secondary | ICD-10-CM

## 2017-06-03 NOTE — Telephone Encounter (Signed)
Orders placed.

## 2017-06-03 NOTE — Telephone Encounter (Signed)
Pt left VM asking for another referral to [facility] for Epidural. Please assist

## 2017-06-03 NOTE — Telephone Encounter (Signed)
[facility] called 

## 2017-06-09 ENCOUNTER — Ambulatory Visit
Admission: RE | Admit: 2017-06-09 | Discharge: 2017-06-09 | Disposition: A | Discharge status: Home / Self Care | Payer: 59 | Source: Ambulatory Visit | Attending: Sports Medicine | Admitting: Sports Medicine

## 2017-06-09 DIAGNOSIS — M5136 Other intervertebral disc degeneration, lumbar region: Secondary | ICD-10-CM

## 2017-06-09 MED ORDER — IOPAMIDOL (ISOVUE-M 200) INJECTION 41%
1.0000 mL | Freq: Once | INTRAMUSCULAR | Status: AC
  Administered 2017-06-09: 1 mL via EPIDURAL

## 2017-06-09 MED ORDER — METHYLPREDNISOLONE ACETATE 40 MG/ML INJ SUSP (RADIOLOG
120.0000 mg | Freq: Once | INTRAMUSCULAR | Status: AC
  Administered 2017-06-09: 120 mg via EPIDURAL

## 2017-06-09 NOTE — Discharge Instructions (Signed)

## 2017-07-31 ENCOUNTER — Ambulatory Visit: Payer: 59

## 2017-08-01 ENCOUNTER — Ambulatory Visit (INDEPENDENT_AMBULATORY_CARE_PROVIDER_SITE_OTHER): Payer: 59 | Admitting: Family Medicine

## 2017-08-01 VITALS — BP 123/52 | HR 79 | Temp 97.5°F | Ht 73.0 in | Wt 223.9 lb

## 2017-08-01 DIAGNOSIS — Z23 Encounter for immunization: Secondary | ICD-10-CM

## 2017-08-01 NOTE — Progress Notes (Signed)
   Subjective:    Patient ID: Steve Maxwell, male    DOB: 18-Oct-1956, 61 y.o.   MRN: [id number]  HPI Pt is here for flu vaccine. Pt denies egg allergy, fever, chest pain, SOB, or any other problems.    Review of Systems     Objective:   Physical Exam        Assessment & Plan:  Pt tolerated injection in left deltoid well and without complications.

## 2017-08-18 ENCOUNTER — Ambulatory Visit: Payer: 59 | Admitting: Family Medicine

## 2017-08-20 ENCOUNTER — Other Ambulatory Visit: Payer: Self-pay | Admitting: Sports Medicine

## 2017-08-20 DIAGNOSIS — M19042 Primary osteoarthritis, left hand: Principal | ICD-10-CM

## 2017-08-20 DIAGNOSIS — M19041 Primary osteoarthritis, right hand: Secondary | ICD-10-CM

## 2017-08-25 ENCOUNTER — Encounter: Payer: Self-pay | Admitting: Family Medicine

## 2017-08-25 ENCOUNTER — Ambulatory Visit (INDEPENDENT_AMBULATORY_CARE_PROVIDER_SITE_OTHER): Payer: 59 | Admitting: Family Medicine

## 2017-08-25 VITALS — BP 129/78 | HR 79 | Ht 73.0 in | Wt 227.0 lb

## 2017-08-25 DIAGNOSIS — R7989 Other specified abnormal findings of blood chemistry: Secondary | ICD-10-CM

## 2017-08-25 DIAGNOSIS — Z1211 Encounter for screening for malignant neoplasm of colon: Secondary | ICD-10-CM

## 2017-08-25 DIAGNOSIS — M5136 Other intervertebral disc degeneration, lumbar region: Secondary | ICD-10-CM | POA: Diagnosis not present

## 2017-08-25 DIAGNOSIS — R799 Abnormal finding of blood chemistry, unspecified: Secondary | ICD-10-CM

## 2017-08-25 DIAGNOSIS — N401 Enlarged prostate with lower urinary tract symptoms: Secondary | ICD-10-CM | POA: Diagnosis not present

## 2017-08-25 MED ORDER — CYCLOBENZAPRINE HCL 10 MG PO TABS: 10.0000 mg | ORAL_TABLET | Freq: Every evening | ORAL | 1 refills | Status: DC | PRN

## 2017-08-25 MED ORDER — TADALAFIL 2.5 MG PO TABS: ORAL_TABLET | ORAL | 11 refills | Status: DC

## 2017-08-25 NOTE — Patient Instructions (Addendum)
Look for creatine on the label of your protein drink. If so, then stop it for 2 weeks then recheck the renal function.

## 2017-08-25 NOTE — Progress Notes (Signed)
Subjective:    Patient ID: Steve Maxwell, male    DOB: 1956-07-01, 61 y.o.   MRN: [id number]  HPI Here to f/u on abnormal kidney function.  He had labs drawn downstairs at lab for his work about 3 weeks ago.  He did not bring a copy of them with him I do not have direct access. [facility]. Mostly he reports that his GFR was less than 60 and so he was told to follow-up with his primary care doctor. He has had some mild impairment of kidney function of the last couple of years but not with a GFR less than 60. He denies any recent changes in urination etc.  BPH - On low dose cialis.  He hasn't been taking it bc of cost about $75 per month.  He wonders if there may be other medication similar to Avodart that may not cause the same side effects. Though he also reports that he will need to be using [facility] from now on because it will be less costly for his medications there.  He drinks 1-2 protein drinks a day from [facility]. Not sure if has creatine.  He works our regularly and does cardio and weight lifting.   Lumbar degenerative disc disease with shoulder pain.  Would like a refill on his Flexeril.  He just uses it as needed.  Review of Systems  BP 129/78   Pulse 79   Ht 6\' 1"  (1.854 m)   Wt 227 lb (103 kg)   SpO2 100%   BMI 29.95 kg/m     Allergies  Allergen Reactions  . Avodart [Dutasteride] Other (See Comments)    Sexual dysfunction  . Viagra [Sildenafil Citrate] Nausea Only    Headaches    Past Medical History:  Diagnosis Date  . 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 HYPERTROPHY PROSTATE W/O UR OBST & OTH LUTS 01/11/2010   Qualifier: Diagnosis of  By: Steve Maxwell, Steve Maxwell      Past Surgical History:  Procedure Laterality Date  . ANKLE SURGERY     Right   . BACK SURGERY  2008  . CARPAL TUNNEL RELEASE     right  . ELBOW SURGERY  2-09   Left and right  . KNEE SURGERY     right x 2  . KNEE SURGERY     Left   . PLANTAR FASCIA  RELEASE     Right   . right rotator cuff surgery      Social History   Social History  . Marital status: Divorced    Spouse name: N/A  . Number of children: 2  . Years of education: N/A   Occupational History  . IT     [employer]   Social History Main Topics  . Smoking status: Never Smoker  . Smokeless tobacco: Never Used  . Alcohol use No  . Drug use: No  . Sexual activity: Not on file   Other Topics Concern  . Not on file   Social History Narrative   Working out every day of  The week.     Family History  Problem Relation Age of Onset  . Alcohol abuse Mother   . Diabetes Father   . Stroke Father   . Thyroid disease Father   . Other Father        back problems  . Kidney disease Father        Renal failure  .  Cancer Other     Outpatient Encounter Prescriptions as of 08/25/2017  Medication Sig  . Colchicine 0.6 MG CAPS TAKE 1 CAPSULE BY MOUTH ONCE DAILY  . diclofenac sodium (VOLTAREN) 1 % GEL Apply 4 g topically 4 (four) times daily. On hand joints  . Tadalafil (CIALIS) 2.5 MG TABS TAKE ONE TABLET BY MOUTH ONCE DAILY FOR  BPH  . valACYclovir (VALTREX) 1000 MG tablet TAKE TWO TABLETS BY MOUTH TWICE DAILY  . [DISCONTINUED] Tadalafil (CIALIS) 2.5 MG TABS TAKE ONE TABLET BY MOUTH ONCE DAILY FOR  BPH  . cyclobenzaprine (FLEXERIL) 10 MG tablet Take 1 tablet (10 mg total) by mouth at bedtime as needed for muscle spasms.  . [DISCONTINUED] cyclobenzaprine (FLEXERIL) 10 MG tablet Take 1 tablet (10 mg total) by mouth at bedtime as needed for muscle spasms.   No facility-administered encounter medications on file as of 08/25/2017.         Objective:   Physical Exam  Constitutional: He is oriented to person, place, and time. He appears well-developed and well-nourished.  HENT:  Head: Normocephalic and atraumatic.  Cardiovascular: Normal rate, regular rhythm and normal heart sounds.   Pulmonary/Chest: Effort normal and breath sounds normal.  Neurological:  He is alert and oriented to person, place, and time.  Skin: Skin is warm and dry.  Psychiatric: He has a normal mood and affect. His behavior is normal.       Assessment & Plan:  BPH -[facility] to see if it might be cheaper.  If not we could certainly consider an alternative since he did not tolerate Avodart.  Let me know what he likes to do.  Abnormal kidney function - check his protein drink for creatine.  Taking this and I want him to stop it for 2 weeks and then recheck his kidney function and do a urine microalbumin.  If he is not taking any additional creatine then okay to get labs rechecked in the next week or 2.  I need to get the original labs from [facility] for comparison.  Lumbar degenerative disc disease-refilled his Flexeril today.

## 2017-09-12 ENCOUNTER — Ambulatory Visit (INDEPENDENT_AMBULATORY_CARE_PROVIDER_SITE_OTHER): Payer: 59 | Admitting: Sports Medicine

## 2017-09-12 ENCOUNTER — Ambulatory Visit (INDEPENDENT_AMBULATORY_CARE_PROVIDER_SITE_OTHER): Payer: 59

## 2017-09-12 ENCOUNTER — Encounter: Payer: Self-pay | Admitting: Sports Medicine

## 2017-09-12 DIAGNOSIS — M19042 Primary osteoarthritis, left hand: Secondary | ICD-10-CM

## 2017-09-12 DIAGNOSIS — M19022 Primary osteoarthritis, left elbow: Secondary | ICD-10-CM

## 2017-09-12 DIAGNOSIS — M19041 Primary osteoarthritis, right hand: Secondary | ICD-10-CM | POA: Diagnosis not present

## 2017-09-12 MED ORDER — IBUPROFEN 800 MG PO TABS: 800.0000 mg | ORAL_TABLET | Freq: Three times a day (TID) | ORAL | 2 refills | Status: DC | PRN

## 2017-09-12 NOTE — Assessment & Plan Note (Signed)
End-stage DIP osteoarthritis. At this point I think it is time to consider fusion. We have tried multiple injections with moderate results. Referral to hand surgery. Switching back to ibuprofen per patient request

## 2017-09-12 NOTE — Progress Notes (Signed)
   Subjective:    I'm seeing this patient as a consultation for: Dr. Nani Gasseratherine Metheney  CC: Left elbow pain  HPI: This is a pleasant 61 year old male, he has long-standing history of elbow trauma, more recently he has had increasing pain and swelling that he localizes over the posterior lateral elbow, worse with range of motion, significant gelling.  Oral NSAIDs are no longer effective.  He does desire interventional treatment today.  He also has end-stage hand osteoarthritis, occasional injections have provided some relief but are no longer efficacious.  At this point he is agreeable to consider DIP fusion.  Past medical history, Surgical history, Family history not pertinant except as noted below, Social history, Allergies, and medications have been entered into the medical record, reviewed, and no changes needed.   Review of Systems: No headache, visual changes, nausea, vomiting, diarrhea, constipation, dizziness, abdominal pain, skin rash, fevers, chills, night sweats, weight loss, swollen lymph nodes, body aches, joint swelling, muscle aches, chest pain, shortness of breath, mood changes, visual or auditory hallucinations.   Objective:   General: Well Developed, well nourished, and in no acute distress.  Neuro:  Extra-ocular muscles intact, able to move all 4 extremities, sensation grossly intact.  Deep tendon reflexes tested were normal. Psych: Alert and oriented, mood congruent with affect. ENT:  Ears and nose appear unremarkable.  Hearing grossly normal. Neck: Unremarkable overall appearance, trachea midline.  No visible thyroid enlargement. Eyes: Conjunctivae and lids appear unremarkable.  Pupils equal and round. Skin: Warm and dry, no rashes noted.  Cardiovascular: Pulses palpable, no extremity edema. Left elbow: Tender over the anconeus with crepitus and pain with range of motion.   Unremarkable to inspection. Range of motion full pronation, supination, flexion,  extension. Strength is full to all of the above directions Stable to varus, valgus stress. Negative moving valgus stress test. Ulnar nerve does not sublux. Negative cubital tunnel Tinel's.   Hands: Widespread Bouchard and Heberden nodes.  Procedure: Real-time Ultrasound Guided Injection of left elbow joint Device: GE Logiq E  Verbal informed consent obtained.  Time-out conducted.  Noted no overlying erythema, induration, or other signs of local infection.  Skin prepped in a sterile fashion.  Local anesthesia: Topical Ethyl chloride.  With sterile technique and under real time ultrasound guidance: 25-gauge needle advanced through the anconeus into the joint and injected 1 cc kenalog 40, 1 cc lidocaine, 1 cc bupivacaine. Completed without difficulty  Pain immediately resolved suggesting accurate placement of the medication.  Advised to call if fevers/chills, erythema, induration, drainage, or persistent bleeding.  Images permanently stored and available for review in the ultrasound unit.  Impression: Technically successful ultrasound guided injection.  Impression and Recommendations:   This case required medical decision making of moderate complexity.  Primary osteoarthritis, left elbow X-rays, elbow joint injection. Return in 1 month.  Osteoarthritis, bilateral hands End-stage DIP osteoarthritis. At this point I think it is time to consider fusion. We have tried multiple injections with moderate results. Referral to hand surgery. Switching back to ibuprofen per patient request  ___________________________________________ Steve Maxwell, M.D., ABFM., CAQSM. Primary Care and Sports Medicine  [facility]  Adjunct Instructor of Family Medicine  [employer]

## 2017-09-12 NOTE — Assessment & Plan Note (Signed)
X-rays, elbow joint injection. Return in 1 month.

## 2017-09-13 LAB — MICROALBUMIN / CREATININE URINE RATIO
Creatinine, Urine: 118 mg/dL (ref 20–320)
Microalb, Ur: <0.2 mg/dL

## 2017-09-13 LAB — BASIC METABOLIC PANEL WITH GFR
BUN: 14 mg/dL (ref 7–25)
CO2: 27 mmol/L (ref 20–32)
CREATININE: 1.25 mg/dL (ref 0.70–1.25)
Calcium: 9.5 mg/dL (ref 8.6–10.3)
Chloride: 105 mmol/L (ref 98–110)
GFR, EST NON AFRICAN AMERICAN: 61 mL/min/{1.73_m2} (ref >=60)
GFR, Est African American: 71 mL/min/{1.73_m2} (ref >=60)
Glucose, Bld: 154 mg/dL — ABNORMAL HIGH (ref 65–99)
Potassium: 4.3 mmol/L (ref 3.5–5.3)
SODIUM: 140 mmol/L (ref 135–146)

## 2017-09-15 NOTE — Progress Notes (Signed)
All labs are normal. 

## 2017-10-10 ENCOUNTER — Ambulatory Visit (INDEPENDENT_AMBULATORY_CARE_PROVIDER_SITE_OTHER): Payer: 59 | Admitting: Sports Medicine

## 2017-10-10 ENCOUNTER — Encounter: Payer: Self-pay | Admitting: Sports Medicine

## 2017-10-10 DIAGNOSIS — M19042 Primary osteoarthritis, left hand: Secondary | ICD-10-CM

## 2017-10-10 DIAGNOSIS — M19041 Primary osteoarthritis, right hand: Secondary | ICD-10-CM | POA: Diagnosis not present

## 2017-10-10 DIAGNOSIS — M19022 Primary osteoarthritis, left elbow: Secondary | ICD-10-CM | POA: Diagnosis not present

## 2017-10-10 MED ORDER — TRAMADOL HCL 50 MG PO TABS: 50.0000 mg | ORAL_TABLET | Freq: Two times a day (BID) | ORAL | 3 refills | Status: DC | PRN

## 2017-10-10 NOTE — Progress Notes (Signed)
  Subjective:    CC: Bilateral hand pain  HPI: Elbow osteoarthritis: Resolved after injection of the last visit  Bilateral hand osteoarthritis: End-stage, worst at the second through fourth distal interphalangeal joints, we have gotten a consult from hand surgery, ultimately his only option is DIP fusion.  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  We can keep him going until then with oral medications and occasional injections, the most recent of which was several months ago.  Past medical history:  Negative.  See flowsheet/record as well for more information.  Surgical history: Negative.  See flowsheet/record as well for more information.  Family history: Negative.  See flowsheet/record as well for more information.  Social history: Negative.  See flowsheet/record as well for more information.  Allergies, and medications have been entered into the medical record, reviewed, and no changes needed.   (To billers/coders, pertinent past medical, social, surgical, family history can be found in problem list, if problem list is marked as reviewed then this indicates that past medical, social, surgical, family history was also reviewed)  Review of Systems: No fevers, chills, night sweats, weight loss, chest pain, or shortness of breath.   Objective:    General: Well Developed, well nourished, and in no acute distress.  Neuro: Alert and oriented x3, extra-ocular muscles intact, sensation grossly intact.  HEENT: Normocephalic, atraumatic, pupils equal round reactive to light, neck supple, no masses, no lymphadenopathy, thyroid nonpalpable.  Skin: Warm and dry, no rashes. Cardiac: Regular rate and rhythm, no murmurs rubs or gallops, no lower extremity edema.  Respiratory: Clear to auscultation bilaterally. Not using accessory muscles, speaking in full  sentences. Hands: Bilateral severe Heberden and Bouchard nodes, very little motion at the DIPs, at the most 20 degrees of flexion and extension. Left elbow: Unremarkable to inspection. Range of motion full pronation, supination, flexion, extension. Strength is full to all of the above directions Stable to varus, valgus stress. Negative moving valgus stress test. No discrete areas of tenderness to palpation. Ulnar nerve does not sublux. Negative cubital tunnel Tinel's.  Impression and Recommendations:    Primary osteoarthritis, left elbow Resolved 2 days after the injection last month.  Osteoarthritis, bilateral hands Principal issue is bilateral second through fourth DIPs. We have done injections that only provide temporary relief. Does well with occasional ibuprofen, I am going to add some tramadol to use for breakthrough pain. 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 ___________________________________________ Ihor Austinhomas J. Benjamin Stainhekkekandam, M.D., ABFM., CAQSM. Primary Care and Sports Medicine [name]  Adjunct Instructor of Family Medicine  [name]

## 2017-10-10 NOTE — Assessment & Plan Note (Signed)
Resolved 2 days after the injection last month.

## 2017-10-10 NOTE — Assessment & Plan Note (Signed)
Principal issue is bilateral second through fourth DIPs. We have done injections that only provide temporary relief. Does well with occasional ibuprofen, I am going to add some tramadol to use for breakthrough pain. 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

## 2017-10-20 ENCOUNTER — Other Ambulatory Visit: Payer: Self-pay | Admitting: Sports Medicine

## 2017-10-29 ENCOUNTER — Telehealth: Payer: Self-pay | Admitting: *Deleted

## 2017-10-29 NOTE — Telephone Encounter (Signed)
Pre Authorization sent to cover my meds. Approvedtoday  Your PA request has been approved. Additional information will be provided in the approval communication. (Message 1145)  Pharmacy notified

## 2017-10-30 NOTE — Telephone Encounter (Signed)
Received VM from [facility] after hours that the Tramadol is still not processing, stating PA is required. Will route for review.

## 2017-10-30 NOTE — Telephone Encounter (Signed)
Called the pharmacy and had them re run it and it went through

## 2017-11-14 ENCOUNTER — Other Ambulatory Visit: Payer: Self-pay

## 2017-11-14 DIAGNOSIS — M19041 Primary osteoarthritis, right hand: Secondary | ICD-10-CM

## 2017-11-14 DIAGNOSIS — M19042 Primary osteoarthritis, left hand: Principal | ICD-10-CM

## 2017-11-14 MED ORDER — IBUPROFEN 800 MG PO TABS: 800.0000 mg | ORAL_TABLET | Freq: Three times a day (TID) | ORAL | 2 refills | Status: DC | PRN

## 2018-02-05 ENCOUNTER — Other Ambulatory Visit: Payer: Self-pay | Admitting: Family Medicine

## 2018-02-18 ENCOUNTER — Encounter: Payer: Self-pay | Admitting: Family Medicine

## 2018-02-18 ENCOUNTER — Ambulatory Visit (INDEPENDENT_AMBULATORY_CARE_PROVIDER_SITE_OTHER): Payer: 59 | Admitting: Family Medicine

## 2018-02-18 ENCOUNTER — Other Ambulatory Visit: Payer: Self-pay | Admitting: Sports Medicine

## 2018-02-18 VITALS — BP 111/89 | HR 108 | Ht 73.0 in | Wt 223.0 lb

## 2018-02-18 DIAGNOSIS — N138 Other obstructive and reflux uropathy: Secondary | ICD-10-CM

## 2018-02-18 DIAGNOSIS — M19041 Primary osteoarthritis, right hand: Secondary | ICD-10-CM

## 2018-02-18 DIAGNOSIS — M19042 Primary osteoarthritis, left hand: Principal | ICD-10-CM

## 2018-02-18 DIAGNOSIS — Z0000 Encounter for general adult medical examination without abnormal findings: Secondary | ICD-10-CM

## 2018-02-18 DIAGNOSIS — N401 Enlarged prostate with lower urinary tract symptoms: Secondary | ICD-10-CM | POA: Diagnosis not present

## 2018-02-18 LAB — CBC WITH DIFFERENTIAL/PLATELET
BASOS PCT: 0.5 %
Basophils Absolute: 37 cells/uL (ref 0–200)
EOS PCT: 0.5 %
Eosinophils Absolute: 37 cells/uL (ref 15–500)
HCT: 52.5 % — ABNORMAL HIGH (ref 38.5–50.0)
HEMOGLOBIN: 17.1 g/dL (ref 13.2–17.1)
Lymphs Abs: 1732 cells/uL (ref 850–3900)
MCH: 26.9 pg — ABNORMAL LOW (ref 27.0–33.0)
MCHC: 32.6 g/dL (ref 32.0–36.0)
MCV: 82.5 fL (ref 80.0–100.0)
MONOS PCT: 6.1 %
MPV: 11.7 fL (ref 7.5–12.5)
NEUTROS ABS: 5143 {cells}/uL (ref 1500–7800)
Neutrophils Relative %: 69.5 %
PLATELETS: 221 10*3/uL (ref 140–400)
RBC: 6.36 10*6/uL — ABNORMAL HIGH (ref 4.20–5.80)
RDW: 13.7 % (ref 11.0–15.0)
TOTAL LYMPHOCYTE: 23.4 %
WBC mixed population: 451 cells/uL (ref 200–950)
WBC: 7.4 10*3/uL (ref 3.8–10.8)

## 2018-02-18 LAB — COMPLETE METABOLIC PANEL WITH GFR
AG Ratio: 1.6 (calc) (ref 1.0–2.5)
ALBUMIN MSPROF: 4.6 g/dL (ref 3.6–5.1)
ALT: 23 U/L (ref 9–46)
AST: 23 U/L (ref 10–35)
Alkaline phosphatase (APISO): 87 U/L (ref 40–115)
BUN / CREAT RATIO: 12 (calc) (ref 6–22)
BUN: 18 mg/dL (ref 7–25)
CALCIUM: 10.2 mg/dL (ref 8.6–10.3)
CO2: 27 mmol/L (ref 20–32)
Chloride: 104 mmol/L (ref 98–110)
Creat: 1.46 mg/dL — ABNORMAL HIGH (ref 0.70–1.25)
GFR, EST AFRICAN AMERICAN: 58 mL/min/{1.73_m2} — AB (ref >=60)
GFR, EST NON AFRICAN AMERICAN: 50 mL/min/{1.73_m2} — AB (ref >=60)
GLOBULIN: 2.9 g/dL (ref 1.9–3.7)
Glucose, Bld: 124 mg/dL — ABNORMAL HIGH (ref 65–99)
Potassium: 4.6 mmol/L (ref 3.5–5.3)
SODIUM: 139 mmol/L (ref 135–146)
TOTAL PROTEIN: 7.5 g/dL (ref 6.1–8.1)
Total Bilirubin: 1 mg/dL (ref 0.2–1.2)

## 2018-02-18 LAB — LIPID PANEL
CHOL/HDL RATIO: 5.2 (calc) — AB (ref <5.0)
Cholesterol: 192 mg/dL (ref <200)
HDL: 37 mg/dL — ABNORMAL LOW (ref >40)
LDL Cholesterol (Calc): 130 mg/dL (calc) — ABNORMAL HIGH
NON-HDL CHOLESTEROL (CALC): 155 mg/dL — AB (ref <130)
Triglycerides: 139 mg/dL (ref <150)

## 2018-02-18 LAB — PSA: PSA: 0.5 ng/mL (ref <=4.0)

## 2018-02-18 LAB — HM COLONOSCOPY

## 2018-02-18 LAB — TSH: TSH: 0.88 m[IU]/L (ref 0.40–4.50)

## 2018-02-18 MED ORDER — TADALAFIL 5 MG PO TABS: 5.0000 mg | ORAL_TABLET | Freq: Every day | ORAL | 3 refills | Status: DC

## 2018-02-18 NOTE — Patient Instructions (Signed)

## 2018-02-18 NOTE — Progress Notes (Signed)
Subjective:    Patient ID: Steve Maxwell, male    DOB: 20-May-1956, 62 y.o.   MRN: [id number]  HPI 62 yo male c/o of dry mouth for the last several months.  But it seem better.  He was taking some over-the-counter cold medicine because he was feeling a little extra drainage and discharge.  He has since stopped the medication and the dry mouth is actually improving.  None of his prescription medications were changed recently.  He still works out regularly and start since [employer] season.  When he is very active at home ago.  In regards to his BPH-he is getting some improvement.  Is not getting up nearly as much at night but feels like it is not as effective for ED symptoms.  He wants to know if he would be able to go up on his dose.   Review of Systems  Apprehensive review of systems is negative except for HPI.   BP 111/89   Pulse (!) 108   Ht 6\' 1"  (1.854 m)   Wt 223 lb (101.2 kg)   SpO2 99%   BMI 29.42 kg/m     Allergies  Allergen Reactions  . Avodart [Dutasteride] Other (See Comments)    Sexual dysfunction  . Viagra [Sildenafil Citrate] Nausea Only    Headaches    Past Medical History:  Diagnosis Date  . 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 HYPERTROPHY PROSTATE W/O UR OBST & OTH LUTS 01/11/2010   Qualifier: Diagnosis of  By: Linford ArnoldMetheney MD, Santina Evansatherine      Past Surgical History:  Procedure Laterality Date  . ANKLE SURGERY     Right   . BACK SURGERY  2008  . CARPAL TUNNEL RELEASE     right  . ELBOW SURGERY  2-09   Left and right  . KNEE SURGERY     right x 2  . KNEE SURGERY     Left   . PLANTAR FASCIA RELEASE     Right   . right rotator cuff surgery      Social History   Socioeconomic History  . Marital status: Divorced    Spouse name: Not on file  . Number of children: 2  . Years of education: Not on file  . Highest education level: Not on file  Occupational History  . Occupation: IT    Comment: [employer]  Social Needs  .  Financial resource strain: Not on file  . Food insecurity:    Worry: Not on file    Inability: Not on file  . Transportation needs:    Medical: Not on file    Non-medical: Not on file  Tobacco Use  . Smoking status: Never Smoker  . Smokeless tobacco: Never Used  Substance and Sexual Activity  . Alcohol use: No  . Drug use: No  . Sexual activity: Not on file  Lifestyle  . Physical activity:    Days per week: Not on file    Minutes per session: Not on file  . Stress: Not on file  Relationships  . Social connections:    Talks on phone: Not on file    Gets together: Not on file    Attends religious service: Not on file    Active member of club or organization: Not on file    Attends meetings of clubs or organizations: Not on file    Relationship status: Not on file  . Intimate partner violence:    Fear  of current or ex partner: Not on file    Emotionally abused: Not on file    Physically abused: Not on file    Forced sexual activity: Not on file  Other Topics Concern  . Not on file  Social History Narrative   Working out every day of  The week.     Family History  Problem Relation Age of Onset  . Alcohol abuse Mother   . Diabetes Father   . Stroke Father   . Thyroid disease Father   . Other Father        back problems  . Kidney disease Father        Renal failure  . Cancer Other     Outpatient Encounter Medications as of 02/18/2018  Medication Sig  . diclofenac sodium (VOLTAREN) 1 % GEL Apply 4 g topically 4 (four) times daily. On hand joints  . ibuprofen (ADVIL,MOTRIN) 800 MG tablet TAKE 1 TABLET BY MOUTH EVERY 8 HOURS AS NEEDED  . traMADol (ULTRAM) 50 MG tablet Take 1 tablet (50 mg total) by mouth 2 (two) times daily as needed.  . valACYclovir (VALTREX) 1000 MG tablet TAKE TWO TABLETS BY MOUTH TWICE DAILY  . [DISCONTINUED] Tadalafil (CIALIS) 2.5 MG TABS TAKE ONE TABLET BY MOUTH ONCE DAILY FOR  BPH  . tadalafil (CIALIS) 5 MG tablet Take 1 tablet (5 mg total) by  mouth daily.   No facility-administered encounter medications on file as of 02/18/2018.          Objective:   Physical Exam  Constitutional: He is oriented to person, place, and time. He appears well-developed and well-nourished.  HENT:  Head: Normocephalic and atraumatic.  Right Ear: External ear normal.  Left Ear: External ear normal.  Nose: Nose normal.  Mouth/Throat: Oropharynx is clear and moist.  TMs and canals are clear.   Eyes: Pupils are equal, round, and reactive to light. Conjunctivae and EOM are normal.  Neck: Neck supple. No thyromegaly present.  Cardiovascular: Normal rate and normal heart sounds.  Pulmonary/Chest: Effort normal and breath sounds normal.  Lymphadenopathy:    He has no cervical adenopathy.  Neurological: He is alert and oriented to person, place, and time.  Skin: Skin is warm and dry.  Psychiatric: He has a normal mood and affect.        Assessment & Plan:  Keep up a regular exercise program and make sure you are eating a healthy diet Try to eat 4 servings of dairy a day, or if you are lactose intolerant take a calcium with vitamin D daily.  Your vaccines are up to date.  Discussed need for shingles vaccine.  He is interested and would like to be added to the list.  Unfortunately the vaccine is on back order currently. Lab order placed he will get him for that today.  BPH-we will increase Cialis to 5 mg daily.  New prescription sent.  Dry mouth-seems to have resolved after discontinuing over-the-counter cold medication.

## 2018-02-24 ENCOUNTER — Other Ambulatory Visit: Payer: Self-pay | Admitting: *Deleted

## 2018-02-24 LAB — HM COLONOSCOPY

## 2018-02-24 MED ORDER — TADALAFIL 5 MG PO TABS: ORAL_TABLET | ORAL | 11 refills | Status: DC

## 2018-03-10 ENCOUNTER — Telehealth: Payer: Self-pay | Admitting: Emergency Medicine

## 2018-03-10 NOTE — Telephone Encounter (Signed)
Patient left message at 11:37am that his dry mouth has returned and that now his lips are also dry. He did not mention having any dyspnea. He did not answer our return call at 1:20pm: I did encourage him to seek immediate care if he felt oral swelling/dryness was inhibiting his breathing.

## 2018-03-11 NOTE — Telephone Encounter (Signed)
Call patient: I know he was expensing dry mouth when he was taking cold medications but it seemed to get better when he came off.  Just make sure he is not taking any type of decongestant which might be in some type of allergy medication which can also cause similar dryness.  The tramadol that he takes could call dry mouth.  I am not sure about the Cialis I would have to double check that one.  I know we adjusted that/change that when he was here.  We can always have him do hemoglobin A 1C just a test for diabetes.  Await sugars can make you urinate more which dehydrates you and can cause dry mouth.

## 2018-03-12 NOTE — Telephone Encounter (Signed)
Left message for a return call

## 2018-03-23 ENCOUNTER — Encounter: Payer: Self-pay | Admitting: Family Medicine

## 2018-04-09 ENCOUNTER — Telehealth: Payer: Self-pay

## 2018-04-09 DIAGNOSIS — M51369 Other intervertebral disc degeneration, lumbar region without mention of lumbar back pain or lower extremity pain: Secondary | ICD-10-CM

## 2018-04-09 DIAGNOSIS — M5136 Other intervertebral disc degeneration, lumbar region: Secondary | ICD-10-CM

## 2018-04-09 NOTE — Telephone Encounter (Signed)
Orders placed for repeat left L4-L5 transforaminal epidural, previous injection was in August 2018.  Okay to contact [facility] for scheduling.

## 2018-04-09 NOTE — Telephone Encounter (Signed)
Pt left VM asking for a new order to [facility] for repeat injections. Please assist.

## 2018-04-20 ENCOUNTER — Ambulatory Visit
Admission: RE | Admit: 2018-04-20 | Discharge: 2018-04-20 | Disposition: A | Discharge status: Home / Self Care | Payer: 59 | Source: Ambulatory Visit | Attending: Sports Medicine | Admitting: Sports Medicine

## 2018-04-20 MED ORDER — IOPAMIDOL (ISOVUE-M 200) INJECTION 41%
1.0000 mL | Freq: Once | INTRAMUSCULAR | Status: AC
  Administered 2018-04-20: 1 mL via EPIDURAL

## 2018-04-20 MED ORDER — METHYLPREDNISOLONE ACETATE 40 MG/ML INJ SUSP (RADIOLOG
120.0000 mg | Freq: Once | INTRAMUSCULAR | Status: AC
  Administered 2018-04-20: 120 mg via EPIDURAL

## 2018-04-20 NOTE — Discharge Instructions (Signed)

## 2018-04-29 ENCOUNTER — Telehealth: Payer: Self-pay | Admitting: *Deleted

## 2018-04-29 NOTE — Telephone Encounter (Signed)
Pt was advised to schedule a f/u.

## 2018-04-29 NOTE — Telephone Encounter (Signed)
Pt called and wanted you to know that he has gotten no relief from his epidural injection last week.

## 2018-04-29 NOTE — Telephone Encounter (Signed)
I think at this point he needs to be reevaluated, it has been a while since we discussed his lumbar spine.

## 2018-05-19 ENCOUNTER — Ambulatory Visit: Payer: 59

## 2018-05-20 ENCOUNTER — Ambulatory Visit (INDEPENDENT_AMBULATORY_CARE_PROVIDER_SITE_OTHER): Payer: 59 | Admitting: Physician Assistant

## 2018-05-20 VITALS — Temp 97.7°F

## 2018-05-20 DIAGNOSIS — Z23 Encounter for immunization: Secondary | ICD-10-CM | POA: Diagnosis not present

## 2018-05-20 NOTE — Progress Notes (Signed)
   Subjective:    Patient ID: Steve Maxwell, male    DOB: 10-01-1956, 62 y.o.   MRN: [id number]  HPI Steve Maxwell is here for the first shingrix injection.    Review of Systems     Objective:   Physical Exam        Assessment & Plan:  Patient tolerated injection well without complications. Patient advised to schedule next injection between 2-6 months from today.   Agree with above plan. Tandy GawJade Breeback PA-C

## 2018-05-21 ENCOUNTER — Encounter: Payer: Self-pay | Admitting: Physician Assistant

## 2018-06-30 ENCOUNTER — Other Ambulatory Visit: Payer: Self-pay | Admitting: Sports Medicine

## 2018-06-30 DIAGNOSIS — M19041 Primary osteoarthritis, right hand: Secondary | ICD-10-CM

## 2018-06-30 DIAGNOSIS — M19042 Primary osteoarthritis, left hand: Principal | ICD-10-CM

## 2018-07-21 ENCOUNTER — Ambulatory Visit (INDEPENDENT_AMBULATORY_CARE_PROVIDER_SITE_OTHER): Payer: 59 | Admitting: Family Medicine

## 2018-07-21 VITALS — Temp 97.3°F | Wt 230.0 lb

## 2018-07-21 DIAGNOSIS — Z23 Encounter for immunization: Secondary | ICD-10-CM | POA: Diagnosis not present

## 2018-07-21 NOTE — Progress Notes (Signed)
Agree with documentation as above.   Catherine Metheney, MD  

## 2018-07-21 NOTE — Progress Notes (Signed)
Pt presents to office for flu and shingrix vaccines.  Pt tolerated both vaccines well.

## 2018-08-12 ENCOUNTER — Telehealth: Payer: Self-pay

## 2018-08-12 ENCOUNTER — Encounter: Payer: Self-pay | Admitting: Sports Medicine

## 2018-08-12 ENCOUNTER — Ambulatory Visit (INDEPENDENT_AMBULATORY_CARE_PROVIDER_SITE_OTHER): Payer: 59 | Admitting: Sports Medicine

## 2018-08-12 ENCOUNTER — Ambulatory Visit (INDEPENDENT_AMBULATORY_CARE_PROVIDER_SITE_OTHER): Payer: 59

## 2018-08-12 DIAGNOSIS — M19071 Primary osteoarthritis, right ankle and foot: Secondary | ICD-10-CM | POA: Diagnosis not present

## 2018-08-12 DIAGNOSIS — L603 Nail dystrophy: Secondary | ICD-10-CM

## 2018-08-12 MED ORDER — TERBINAFINE HCL 250 MG PO TABS: 250.0000 mg | ORAL_TABLET | Freq: Every day | ORAL | 1 refills | Status: AC

## 2018-08-12 NOTE — Telephone Encounter (Signed)
Pt called stating he received an injection in his foot this morning and it is now "throbbing like a tooth ache". 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 Pt reports swelling and extreme pain.   While on phone, pt states he is driving himself to [facility]. Right foot is the foot in pain and I advised pt to pull over and call 911 to take him to hospital since he was in excruciating pain, pt declined and states he wants to drive himself.  Advised pt I would let Dr T know

## 2018-08-12 NOTE — Assessment & Plan Note (Signed)
Tarsometatarsal bossing and osteoarthritis. Injection as above with failure of conservative measures. X-rays, return for custom molded orthotics.

## 2018-08-12 NOTE — Assessment & Plan Note (Signed)
Trial of antifungals for 6 months before multiple nail plate removal with phenol matricectomy.

## 2018-08-12 NOTE — Progress Notes (Signed)
Subjective:    I'm seeing this patient as a consultation for: Dr. Nani Gasser  CC: Right foot pain  HPI: Steve Maxwell is a pleasant 62 year old male, for several months he has had pain over the dorsum of his right foot, moderate, persistent without radiation, no trauma.  He has tried oral NSAIDs, no improvement.  In addition for decades he has had thickened, yellow toenails post toenail excision by an outside provider.  Has never been treated with antifungals.  I reviewed the past medical history, family history, social history, surgical history, and allergies today and no changes were needed.  Please see the problem list section below in epic for further details.  Past Medical History: Past Medical History:  Diagnosis Date  . 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 HYPERTROPHY PROSTATE W/O UR OBST & OTH LUTS 01/11/2010   Qualifier: Diagnosis of  By: Linford Arnold MD, Santina Evans     Past Surgical History: Past Surgical History:  Procedure Laterality Date  . ANKLE SURGERY     Right   . BACK SURGERY  2008  . CARPAL TUNNEL RELEASE     right  . ELBOW SURGERY  2-09   Left and right  . KNEE SURGERY     right x 2  . KNEE SURGERY     Left   . PLANTAR FASCIA RELEASE     Right   . right rotator cuff surgery     Social History: Social History   Socioeconomic History  . Marital status: Divorced    Spouse name: Not on file  . Number of children: 2  . Years of education: Not on file  . Highest education level: Not on file  Occupational History  . Occupation: IT    Comment: [employer]  Social Needs  . Financial resource strain: Not on file  . Food insecurity:    Worry: Not on file    Inability: Not on file  . Transportation needs:    Medical: Not on file    Non-medical: Not on file  Tobacco Use  . Smoking status: Never Smoker  . Smokeless tobacco: Never Used  Substance and Sexual Activity  . Alcohol use: No  . Drug use: No  . Sexual activity: Not on  file  Lifestyle  . Physical activity:    Days per week: Not on file    Minutes per session: Not on file  . Stress: Not on file  Relationships  . Social connections:    Talks on phone: Not on file    Gets together: Not on file    Attends religious service: Not on file    Active member of club or organization: Not on file    Attends meetings of clubs or organizations: Not on file    Relationship status: Not on file  Other Topics Concern  . Not on file  Social History Narrative   Working out every day of  The week.    Family History: Family History  Problem Relation Age of Onset  . Alcohol abuse Mother   . Diabetes Father   . Stroke Father   . Thyroid disease Father   . Other Father        back problems  . Kidney disease Father        Renal failure  . Cancer Other    Allergies: Allergies  Allergen Reactions  . Avodart [Dutasteride] Other (See Comments)    Sexual dysfunction  . Viagra [Sildenafil Citrate] Nausea Only  Headaches   Medications: See med rec.  Review of Systems: No headache, visual changes, nausea, vomiting, diarrhea, constipation, dizziness, abdominal pain, skin rash, fevers, chills, night sweats, weight loss, swollen lymph nodes, body aches, joint swelling, muscle aches, chest pain, shortness of breath, mood changes, visual or auditory hallucinations.   Objective:   General: Well Developed, well nourished, and in no acute distress.  Neuro:  Extra-ocular muscles intact, able to move all 4 extremities, sensation grossly intact.  Deep tendon reflexes tested were normal. Psych: Alert and oriented, mood congruent with affect. ENT:  Ears and nose appear unremarkable.  Hearing grossly normal. Neck: Unremarkable overall appearance, trachea midline.  No visible thyroid enlargement. Eyes: Conjunctivae and lids appear unremarkable.  Pupils equal and round. Skin: Warm and dry, no rashes noted.  Cardiovascular: Pulses palpable, no extremity edema. Right foot: No  visible erythema or swelling. He does have bilateral onychodystrophy with thickening and yellowing. Range of motion is full in all directions. Strength is 5/5 in all directions. No hallux valgus. No pes cavus or pes planus. No abnormal callus noted. No pain over the navicular prominence, or base of fifth metatarsal. No tenderness to palpation of the calcaneal insertion of plantar fascia. No pain at the Achilles insertion. No pain over the calcaneal bursa. No pain of the retrocalcaneal bursa. Visibly swollen at the tarsometatarsal joint with mild bossing and tenderness to palpation. No hallux rigidus or limitus. No tenderness palpation over interphalangeal joints. No pain with compression of the metatarsal heads. Neurovascularly intact distally.  Procedure: Real-time Ultrasound Guided Injection of right tarsometatarsal joint Device: GE Logiq E  Verbal informed consent obtained.  Time-out conducted.  Noted no overlying erythema, induration, or other signs of local infection.  Skin prepped in a sterile fashion.  Local anesthesia: Topical Ethyl chloride.  With sterile technique and under real time ultrasound guidance: 25-gauge needle advanced just deep to the dorsalis pedis artery into the joint, I injected 1 cc kenalog 40, 1 cc lidocaine. Completed without difficulty  Pain immediately resolved suggesting accurate placement of the medication.  Advised to call if fevers/chills, erythema, induration, drainage, or persistent bleeding.  Images permanently stored and available for review in the ultrasound unit.  Impression: Technically successful ultrasound guided injection.  X-rays personally reviewed, there is significant talonavicular and naviculometatarsal osteoarthritis.  Impression and Recommendations:   This case required medical decision making of moderate complexity.  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 Injection as above with  failure of conservative measures. X-rays, return for custom molded orthotics.  Onychodystrophy Trial of antifungals for 6 months before multiple nail plate removal with phenol matricectomy. ___________________________________________ Ihor Austin. Benjamin Stain, M.D., ABFM., CAQSM. Primary Care and Sports Medicine [place] [facility]  Adjunct Instructor of Family Medicine  [employer]

## 2018-08-13 NOTE — Telephone Encounter (Signed)
All normal a few hours after injection, he is probably feeling wonderful now.

## 2018-08-18 ENCOUNTER — Ambulatory Visit (INDEPENDENT_AMBULATORY_CARE_PROVIDER_SITE_OTHER): Payer: 59 | Admitting: Sports Medicine

## 2018-08-18 ENCOUNTER — Encounter: Payer: Self-pay | Admitting: Sports Medicine

## 2018-08-18 DIAGNOSIS — M19071 Primary osteoarthritis, right ankle and foot: Secondary | ICD-10-CM

## 2018-08-18 NOTE — Progress Notes (Signed)
    Patient was fitted for a : standard, cushioned, semi-rigid orthotic. The orthotic was heated and afterward the patient stood on the orthotic blank positioned on the orthotic stand. The patient was positioned in subtalar neutral position and 10 degrees of ankle dorsiflexion in a weight bearing stance. After completion of molding, a stable base was applied to the orthotic blank. The blank was ground to a stable position for weight bearing. Size: 13 Base: White EVA Additional Posting and Padding: None The patient ambulated these, and they were very comfortable.  I spent 40 minutes with this patient, greater than 50% was face-to-face time counseling regarding the below diagnosis.  ___________________________________________ Tamatha Gadbois J. Broc Caspers, M.D., ABFM., CAQSM. Primary Care and Sports Medicine [facility]  Adjunct Instructor of Family Medicine  [employer]   

## 2018-08-18 NOTE — Assessment & Plan Note (Signed)
Steroid flare after tarsometatarsal injection at the last visit but now pain-free. Custom orthotics as above. Return in 1 month.

## 2018-09-09 ENCOUNTER — Encounter: Payer: Self-pay | Admitting: Sports Medicine

## 2018-09-09 ENCOUNTER — Ambulatory Visit (INDEPENDENT_AMBULATORY_CARE_PROVIDER_SITE_OTHER): Payer: 59

## 2018-09-09 ENCOUNTER — Ambulatory Visit (INDEPENDENT_AMBULATORY_CARE_PROVIDER_SITE_OTHER): Payer: 59 | Admitting: Sports Medicine

## 2018-09-09 DIAGNOSIS — M25511 Pain in right shoulder: Secondary | ICD-10-CM | POA: Diagnosis not present

## 2018-09-09 MED ORDER — MELOXICAM 15 MG PO TABS: ORAL_TABLET | ORAL | 3 refills | Status: DC

## 2018-09-09 NOTE — Assessment & Plan Note (Signed)
Suspect supraspinatus and infraspinatus tears. Very weak to abduction and external rotation. X-rays, meloxicam, home rehab exercises.   Return in 1 month, MRI if no better.

## 2018-09-09 NOTE — Progress Notes (Signed)
Subjective:    I'm seeing this patient as a consultation for: Dr. Nani Gasser  CC: Right shoulder pain  HPI: Steve Maxwell is a pleasant 62 year old male, normally he warms up for exercises, more recently this weekend he was rushed, did not warm up and started dips, felt a tearing sensation over his deltoid, since then he said pain over the deltoid, worse with overhead activities with significant weakness, slightly improving.  I reviewed the past medical history, family history, social history, surgical history, and allergies today and no changes were needed.  Please see the problem list section below in epic for further details.  Past Medical History: Past Medical History:  Diagnosis Date  . 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 HYPERTROPHY PROSTATE W/O UR OBST & OTH LUTS 01/11/2010   Qualifier: Diagnosis of  By: Linford Arnold MD, Santina Evans     Past Surgical History: Past Surgical History:  Procedure Laterality Date  . ANKLE SURGERY     Right   . BACK SURGERY  2008  . CARPAL TUNNEL RELEASE     right  . ELBOW SURGERY  2-09   Left and right  . KNEE SURGERY     right x 2  . KNEE SURGERY     Left   . PLANTAR FASCIA RELEASE     Right   . right rotator cuff surgery     Social History: Social History   Socioeconomic History  . Marital status: Divorced    Spouse name: Not on file  . Number of children: 2  . Years of education: Not on file  . Highest education level: Not on file  Occupational History  . Occupation: IT    Comment: Lexicographer  Social Needs  . Financial resource strain: Not on file  . Food insecurity:    Worry: Not on file    Inability: Not on file  . Transportation needs:    Medical: Not on file    Non-medical: Not on file  Tobacco Use  . Smoking status: Never Smoker  . Smokeless tobacco: Never Used  Substance and Sexual Activity  . Alcohol use: No  . Drug use: No  . Sexual activity: Not on file  Lifestyle  . Physical  activity:    Days per week: Not on file    Minutes per session: Not on file  . Stress: Not on file  Relationships  . Social connections:    Talks on phone: Not on file    Gets together: Not on file    Attends religious service: Not on file    Active member of club or organization: Not on file    Attends meetings of clubs or organizations: Not on file    Relationship status: Not on file  Other Topics Concern  . Not on file  Social History Narrative   Working out every day of  The week.    Family History: Family History  Problem Relation Age of Onset  . Alcohol abuse Mother   . Diabetes Father   . Stroke Father   . Thyroid disease Father   . Other Father        back problems  . Kidney disease Father        Renal failure  . Cancer Other    Allergies: Allergies  Allergen Reactions  . Avodart [Dutasteride] Other (See Comments)    Sexual dysfunction  . Viagra [Sildenafil Citrate] Nausea Only    Headaches   Medications: See med  rec.  Review of Systems: No headache, visual changes, nausea, vomiting, diarrhea, constipation, dizziness, abdominal pain, skin rash, fevers, chills, night sweats, weight loss, swollen lymph nodes, body aches, joint swelling, muscle aches, chest pain, shortness of breath, mood changes, visual or auditory hallucinations.   Objective:   General: Well Developed, well nourished, and in no acute distress.  Neuro:  Extra-ocular muscles intact, able to move all 4 extremities, sensation grossly intact.  Deep tendon reflexes tested were normal. Psych: Alert and oriented, mood congruent with affect. ENT:  Ears and nose appear unremarkable.  Hearing grossly normal. Neck: Unremarkable overall appearance, trachea midline.  No visible thyroid enlargement. Eyes: Conjunctivae and lids appear unremarkable.  Pupils equal and round. Skin: Warm and dry, no rashes noted.  Cardiovascular: Pulses palpable, no extremity edema. Right shoulder: Inspection reveals no  abnormalities, atrophy or asymmetry. Palpation is normal with no tenderness over AC joint or bicipital groove. ROM is full in all planes. Rotator cuff strength weak to abduction and external rotation Positive Neer and Hawkin's tests, empty can. Speeds and Yergason's tests normal. No labral pathology noted with negative Obrien's, negative crank, negative clunk, and good stability. Normal scapular function observed. No painful arc and no drop arm sign. No apprehension sign  Impression and Recommendations:   This case required medical decision making of moderate complexity.  Right shoulder pain Suspect supraspinatus and infraspinatus tears. Very weak to abduction and external rotation. X-rays, meloxicam, home rehab exercises.   Return in 1 month, MRI if no better.  ___________________________________________ Ihor Austin. Benjamin Stain, M.D., ABFM., CAQSM. Primary Care and Sports Medicine [facility]  Adjunct Professor of Family Medicine  [employer]

## 2018-09-10 ENCOUNTER — Ambulatory Visit (INDEPENDENT_AMBULATORY_CARE_PROVIDER_SITE_OTHER): Payer: 59 | Admitting: Sports Medicine

## 2018-09-10 DIAGNOSIS — M19071 Primary osteoarthritis, right ankle and foot: Secondary | ICD-10-CM

## 2018-09-10 NOTE — Progress Notes (Signed)
    Patient was fitted for a : standard, cushioned, semi-rigid orthotic. The orthotic was heated and afterward the patient stood on the orthotic blank positioned on the orthotic stand. The patient was positioned in subtalar neutral position and 10 degrees of ankle dorsiflexion in a weight bearing stance. After completion of molding, a stable base was applied to the orthotic blank. The blank was ground to a stable position for weight bearing. Size: 13 Base: White EVA Additional Posting and Padding: None The patient ambulated these, and they were very comfortable.  I spent 40 minutes with this patient, greater than 50% was face-to-face time counseling regarding the below diagnosis.  ___________________________________________ Valree Feild J. Islam Eichinger, M.D., ABFM., CAQSM. Primary Care and Sports Medicine [facility]  Adjunct Instructor of Family Medicine  [employer]   

## 2018-09-10 NOTE — Assessment & Plan Note (Signed)
Submetatarsal injection with complete pain relief, did extremely well with first set of orthotics, second set of orthotics made today, he will return in 6 weeks for his third set of orthotics.

## 2018-10-07 ENCOUNTER — Other Ambulatory Visit: Payer: Self-pay | Admitting: Family Medicine

## 2018-10-09 ENCOUNTER — Ambulatory Visit (INDEPENDENT_AMBULATORY_CARE_PROVIDER_SITE_OTHER): Payer: 59 | Admitting: Sports Medicine

## 2018-10-09 ENCOUNTER — Encounter: Payer: Self-pay | Admitting: Sports Medicine

## 2018-10-09 DIAGNOSIS — M25511 Pain in right shoulder: Secondary | ICD-10-CM

## 2018-10-09 DIAGNOSIS — M19071 Primary osteoarthritis, right ankle and foot: Secondary | ICD-10-CM

## 2018-10-09 NOTE — Progress Notes (Signed)
    Patient was fitted for a : standard, cushioned, semi-rigid orthotic. The orthotic was heated and afterward the patient stood on the orthotic blank positioned on the orthotic stand. The patient was positioned in subtalar neutral position and 10 degrees of ankle dorsiflexion in a weight bearing stance. After completion of molding, a stable base was applied to the orthotic blank. The blank was ground to a stable position for weight bearing. Size: 14 Base: White EVA Additional Posting and Padding: None The patient ambulated these, and they were very comfortable.  I spent 40 minutes with this patient, greater than 50% was face-to-face time counseling regarding the below diagnosis.  ___________________________________________ Thomas J. Thekkekandam, M.D., ABFM., CAQSM. Primary Care and Sports Medicine [facility]  Adjunct Instructor of Family Medicine  [employer]   

## 2018-10-09 NOTE — Assessment & Plan Note (Addendum)
Acute shoulder pain and weakness, suspect supraspinatus and infraspinatus tears. Strength has improved to some degree over the past month but he is still fairly weak. I think we do need to proceed with MRI of the right shoulder, this will be tentatively scheduled for Monday morning. Orders placed today. If large retracted tears are seen then I would refer him to Dr. Ramond Marrowax Varkey for cuff repair.  There is an articular sided tear with retraction in the supraspinatus, though I do think we can treat this conservatively with aggressive therapy I would like him to get a surgical opinion from Dr. Everardo PacificVarkey, referral placed.

## 2018-10-09 NOTE — Assessment & Plan Note (Signed)
Has done well with orthotics, new set today.

## 2018-10-12 ENCOUNTER — Ambulatory Visit (INDEPENDENT_AMBULATORY_CARE_PROVIDER_SITE_OTHER): Payer: 59

## 2018-10-12 DIAGNOSIS — M25511 Pain in right shoulder: Secondary | ICD-10-CM

## 2018-10-12 NOTE — Addendum Note (Signed)
Addended by: Monica BectonHEKKEKANDAM, Leaha Cuervo J on: 10/12/2018 10:08 AM   Modules accepted: Orders

## 2018-10-16 IMAGING — DX DG ELBOW COMPLETE 3+V*L*
4 series · 4 of 4 positions shown · non-contrast
Comparison: None.

CLINICAL DATA: Chronic left elbow pain. Primary osteoarthritis of
the left elbow.

EXAM:
LEFT ELBOW - COMPLETE 3+ VIEW

[elbow ap]
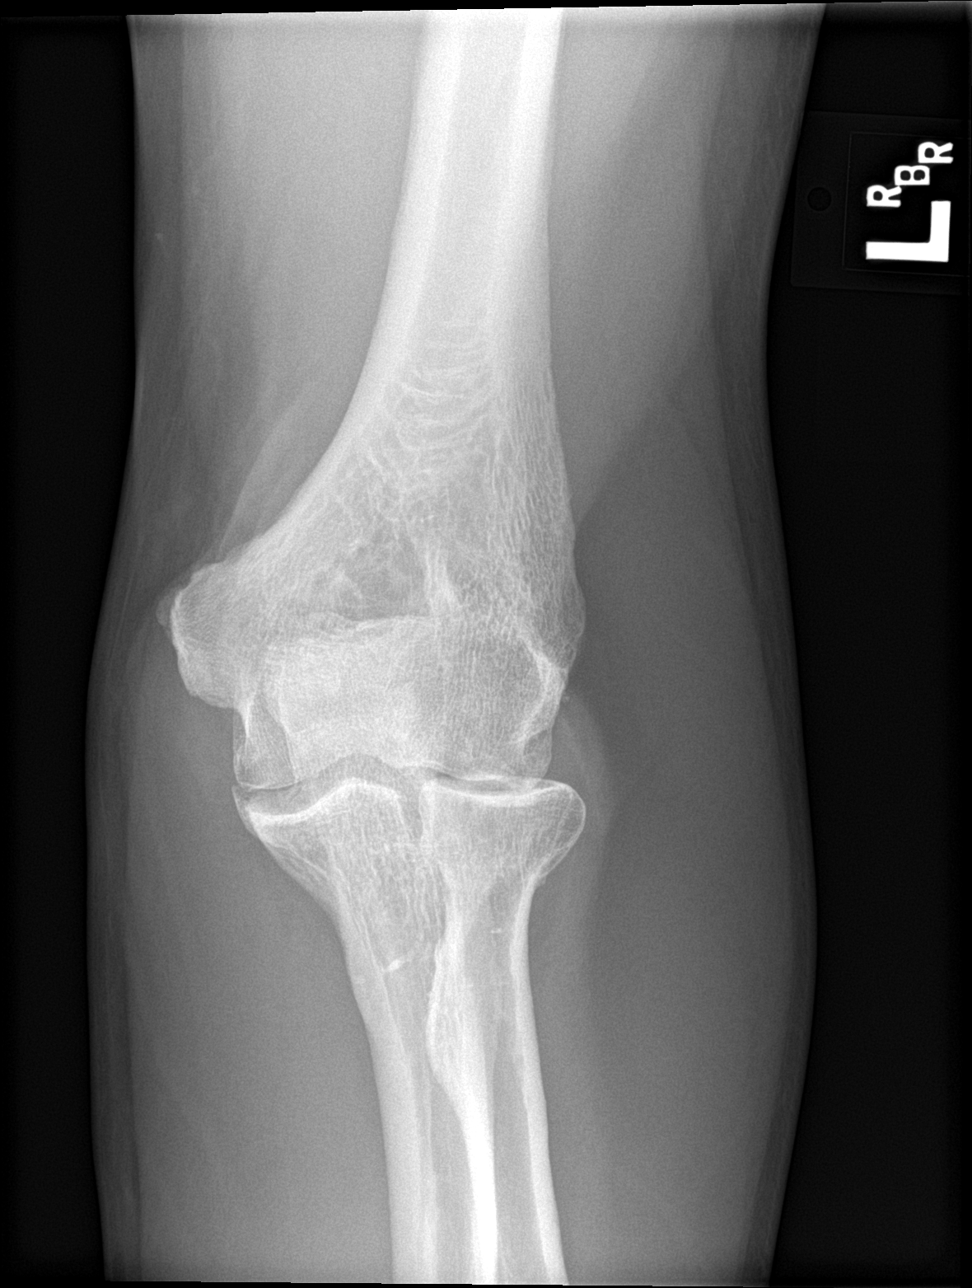

[elbow obl (1 of 2)]
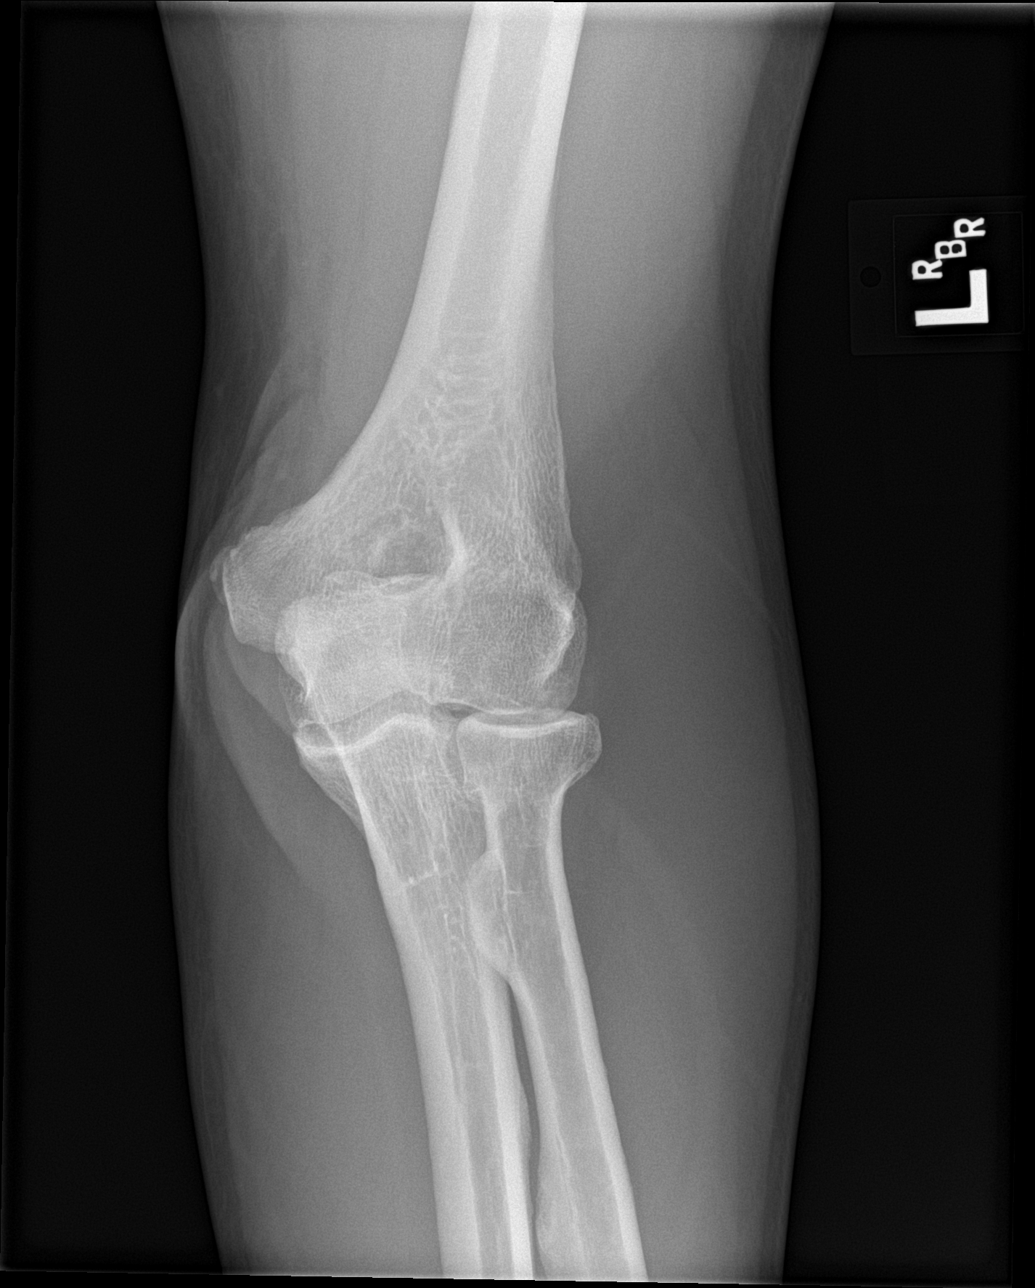

[elbow obl (2 of 2)]
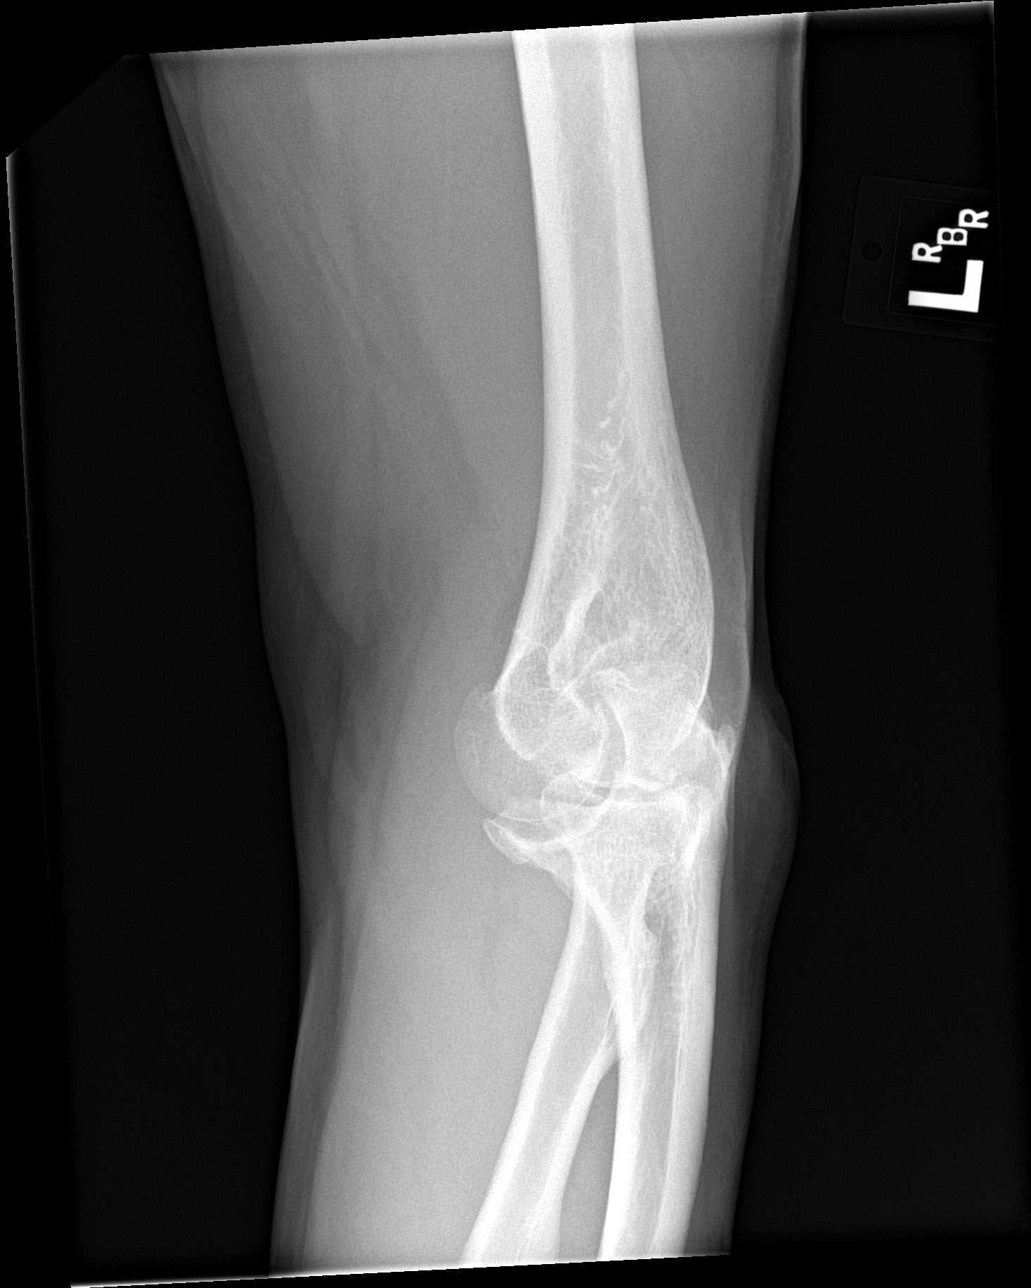

[elbow lat]
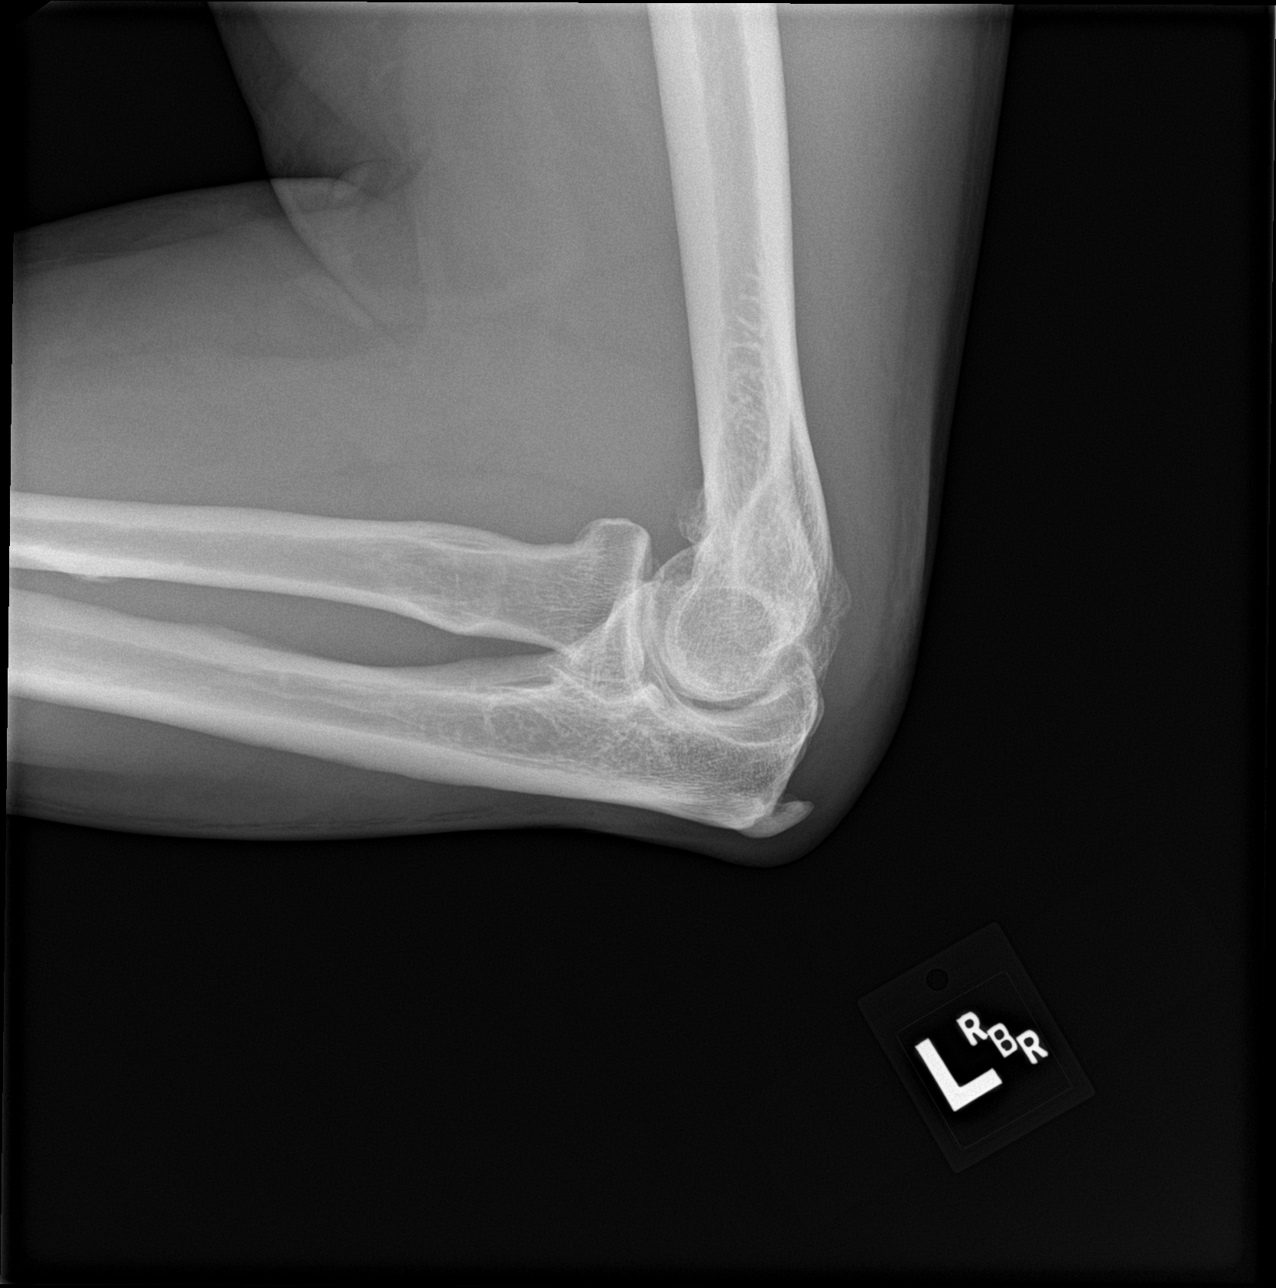

[4 of 4 positions shown; findings below may reference images not displayed]

FINDINGS: There is joint space narrowing between the radial head and the
capitellum. There marginal osteophytes in the joint as well as an
enthesophyte at the distal triceps insertion.

No appreciable joint effusion.

No fracture.

Calcification at the medial epicondyle at the origin of the common
flexor tendon.
IMPRESSION: Osteoarthritic changes of the left elbow with chronic degenerative
changes at the tendon origins and insertions as described.

## 2018-11-03 ENCOUNTER — Other Ambulatory Visit: Payer: Self-pay

## 2018-11-03 ENCOUNTER — Ambulatory Visit (INDEPENDENT_AMBULATORY_CARE_PROVIDER_SITE_OTHER): Payer: 59 | Admitting: Physical Therapy

## 2018-11-03 ENCOUNTER — Encounter: Payer: Self-pay | Admitting: Physical Therapy

## 2018-11-03 DIAGNOSIS — M25611 Stiffness of right shoulder, not elsewhere classified: Secondary | ICD-10-CM | POA: Diagnosis not present

## 2018-11-03 DIAGNOSIS — M6281 Muscle weakness (generalized): Secondary | ICD-10-CM | POA: Diagnosis not present

## 2018-11-03 DIAGNOSIS — M25511 Pain in right shoulder: Secondary | ICD-10-CM

## 2018-11-03 DIAGNOSIS — R293 Abnormal posture: Secondary | ICD-10-CM | POA: Diagnosis not present

## 2018-11-03 NOTE — Patient Instructions (Signed)
Access Code: [email or web address]  URL: [email or web address]  Date: 11/03/2018  Prepared by: Moshe CiproStephanie Cassara Nida   Exercises  Circular Shoulder Pendulum with Table Support - 10 reps - 3 sets - 3x daily - 7x weekly  Flexion-Extension Shoulder Pendulum with Table Support - 10 reps - 3 sets - 3x daily - 7x weekly  Horizontal Shoulder Pendulum with Table Support - 10 reps - 3 sets - 3x daily - 7x weekly  Standing Elbow Flexion Extension AROM - 10 reps - 3 sets - 3x daily - 7x weekly  Seated Forearm Pronation and Supination AROM - 10 reps - 3 sets - 3x daily - 7x weekly  Wrist AROM Flexion Extension - 10 reps - 3 sets - 3x daily - 7x weekly  Gripping in Sitting - 10 reps - 3 sets - 3x daily - 7x weekly

## 2018-11-03 NOTE — Therapy (Signed)
[facility] [address] [place], [place], [address] Phone: [phone]   Fax:  [phone]  Physical Therapy Evaluation  Patient Details  Name: Steve Maxwell MRN: [id number] Date of Birth: [birth year] Referring Provider (PT): Dr. Ramond Marrow   Encounter Date: 11/03/2018  PT End of Session - 11/03/18 1247    Visit Number  1    Number of Visits  24    Date for PT Re-Evaluation  01/26/19    Authorization Type  [employer]    Authorization - Number of Visits  60    PT Start Time  1200    PT Stop Time  1252    PT Time Calculation (min)  52 min    Activity Tolerance  Patient tolerated treatment well    Behavior During Therapy  [facility] for tasks assessed/performed       Past Medical History:  Diagnosis Date  . 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 HYPERTROPHY PROSTATE W/O UR OBST & OTH LUTS 01/11/2010   Qualifier: Diagnosis of  By: Linford Arnold MD, Santina Evans      Past Surgical History:  Procedure Laterality Date  . ANKLE SURGERY     Right   . BACK SURGERY  2008  . CARPAL TUNNEL RELEASE     right  . ELBOW SURGERY  2-09   Left and right  . KNEE SURGERY     right x 2  . KNEE SURGERY     Left   . PLANTAR FASCIA RELEASE     Right   . right rotator cuff surgery      There were no vitals filed for this visit.   Subjective Assessment - 11/03/18 1202    Subjective  Pt is a 62 y/o male who presents to OPPT s/p Rt RTC repair with extensive debridement on 10/19/18.      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  Yes    Pain Score  0-No pain   up to 1/10   Pain Location  Shoulder    Pain Orientation  Right    Pain Descriptors / Indicators  Throbbing    Pain Type  Surgical pain    Pain Onset  1 to 4 weeks ago    Pain Frequency  Intermittent    Aggravating Factors   n/a    Pain Relieving Factors  advil         [facility] PT  Assessment - 11/03/18 1205      Assessment   Medical Diagnosis  Rt RTC repair    Referring Provider (PT)  Dr. Ramond Marrow    Onset Date/Surgical Date  10/19/18    Hand Dominance  Right    Next MD Visit  12/01/18    Prior Therapy  none (2nd RTC repair on Rt)      Precautions   Precautions  Shoulder    Type of Shoulder Precautions  follow protocol    Required Braces or Orthoses  Sling      Balance Screen   Has the patient fallen in the past 6 months  No    Has the patient had a decrease in activity level because of a fear of falling?   No    Is the patient reluctant to leave their home because of a fear of falling?   No      Home Environment  Living Environment  Private residence    Living Arrangements  Alone    Additional Comments  has no assistance currently      Prior Function   Level of Independence  Independent    Vocation  Retired;Part time employment    Vocation Requirements  part time umpire for [employer]    Leisure  daily exercise (home gym)      Cognition   Overall Cognitive Status  Within Functional Limits for tasks assessed      Observation/Other Assessments   Focus on Therapeutic Outcomes (FOTO)   49 (51% limited; predicted 28% limited)      Posture/Postural Control   Posture/Postural Control  Postural limitations    Postural Limitations  Rounded Shoulders;Forward head      ROM / Strength   AROM / PROM / Strength  PROM;AROM;Strength      AROM   AROM Assessment Site  Shoulder    Right/Left Shoulder  Left    Left Shoulder Flexion  158 Degrees    Left Shoulder ABduction  160 Degrees    Left Shoulder Internal Rotation  90 Degrees    Left Shoulder External Rotation  80 Degrees      PROM   PROM Assessment Site  Shoulder    Right/Left Shoulder  Right    Right Shoulder Flexion  93 Degrees    Right Shoulder ABduction  68 Degrees    Right Shoulder External Rotation  40 Degrees      Strength   Overall Strength Comments  deferred due to post op  status                Objective measurements completed on examination: See above findings.      [facility] Adult PT Treatment/Exercise - 11/03/18 1205      Exercises   Exercises  Shoulder      Shoulder Exercises: Seated   Other Seated Exercises  AROM exercises: elbow flex/ext x 15; pronation/supination x 15; wrist flex/ext x 15; grip strength 5x5 sec      Shoulder Exercises: ROM/Strengthening   Pendulum  ant/post; lateral; circles x 20 reps each direction      Modalities   Modalities  Vasopneumatic      Vasopneumatic   Number Minutes Vasopneumatic   10 minutes    Vasopnuematic Location   Shoulder    Vasopneumatic Pressure  Low    Vasopneumatic Temperature   34      Manual Therapy   Manual Therapy  Passive ROM    Passive ROM  Rt shoulder within protocol limitations all motions; mod cues to relax and decrease guarding             PT Education - 11/03/18 1247    Education Details  HEP    Person(s) Educated  Patient    Methods  Explanation;Demonstration;Handout    Comprehension  Verbalized understanding;Need further instruction;Returned demonstration       PT Short Term Goals - 11/03/18 1250      PT SHORT TERM GOAL #1   Title  independent with intial HEP    Status  New    Target Date  12/01/18      PT SHORT TERM GOAL #2   Title  Rt shoulder PROM to protocol limits in order to progress into next phase    Status  New    Target Date  12/01/18        PT Long Term Goals - 11/03/18 1252  PT LONG TERM GOAL #1   Title  independent with advanced HEP    Status  New    Target Date  01/26/19      PT LONG TERM GOAL #2   Title  Rt shoulder AROM within 3-5 degrees of Lt shoulder for improved ROM and function    Status  New    Target Date  01/26/19      PT LONG TERM GOAL #3   Title  tolerate strengthening exercises with pain < 3/10 for improved function    Status  New    Target Date  01/26/19      PT LONG TERM GOAL #4   Title  report ability to  perform ADLs without modifications for improved function    Status  New    Target Date  01/26/19             Plan - 11/03/18 1247    Clinical Impression Statement  Pt is a 62 y/o male who presents to OPPT s/p Rt RTC repair on 10/19/18.  Pt demonstrates postural abnormalities, decreased ROM and strength affecting functional mobility.  Pt will benefit from PT to address deficits listed.    History and Personal Factors relevant to plan of care:  prior Rt RTC repair, multiple orthopedic surgeries    Clinical Presentation  Stable    Clinical Decision Making  Low    Rehab Potential  Good    PT Frequency  2x / week    PT Duration  12 weeks    PT Treatment/Interventions  ADLs/Self Care Home Management;Cryotherapy;Electrical Stimulation;Moist Heat;Ultrasound;Therapeutic activities;Therapeutic exercise;Neuromuscular re-education;Manual techniques;Patient/family education;Passive range of motion;Vasopneumatic Device;Taping    PT Next Visit Plan  review HEP, PROM (no AAROM until 6 weeks); manual/modalities    PT Home Exercise Plan  Access Code: [email or web address]    Consulted and Agree with Plan of Care  Patient       Patient will benefit from skilled therapeutic intervention in order to improve the following deficits and impairments:  Pain, Postural dysfunction, Decreased range of motion, Decreased strength, Decreased scar mobility, Impaired UE functional use  Visit Diagnosis: Acute pain of right shoulder - Plan: PT plan of care cert/re-cert  Stiffness of right shoulder, not elsewhere classified - Plan: PT plan of care cert/re-cert  Abnormal posture - Plan: PT plan of care cert/re-cert  Muscle weakness (generalized) - Plan: PT plan of care cert/re-cert     Problem List Patient Active Problem List   Diagnosis Date Noted  . Primary osteoarthritis of right foot 08/12/2018  . 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 BPH with obstruction/lower urinary tract  symptoms 03/23/2013  . Osteoarthritis of right knee 12/04/2012  . Osteoarthritis, bilateral hands 12/04/2012  . Right shoulder pain 10/09/2012  . 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 Insomnia 10/09/2012  . Obesity 12/12/2011  . Hypogonadism male 03/20/2011  . ED (erectile dysfunction) 02/18/2011  . HYPERTROPHY PROSTATE W/O UR OBST & OTH LUTS 01/11/2010  . Lumbar degenerative disc disease 01/11/2010  . HEADACHE 10/16/2009      Clarita CraneStephanie F Heather Mckendree, PT, DPT 11/03/18 12:58 PM     [facility] [address] [place], [place], [address] Phone: [phone]   Fax:  [phone]  Name: Jeoffrey MassedLonnie R Krenzer MRN: [id number] Date of Birth: 02/09/1956

## 2018-11-05 ENCOUNTER — Encounter: Payer: Self-pay | Admitting: Physical Therapy

## 2018-11-05 ENCOUNTER — Ambulatory Visit (INDEPENDENT_AMBULATORY_CARE_PROVIDER_SITE_OTHER): Payer: 59 | Admitting: Physical Therapy

## 2018-11-05 DIAGNOSIS — M25611 Stiffness of right shoulder, not elsewhere classified: Secondary | ICD-10-CM | POA: Diagnosis not present

## 2018-11-05 DIAGNOSIS — M6281 Muscle weakness (generalized): Secondary | ICD-10-CM | POA: Diagnosis not present

## 2018-11-05 DIAGNOSIS — M25511 Pain in right shoulder: Secondary | ICD-10-CM

## 2018-11-05 DIAGNOSIS — R293 Abnormal posture: Secondary | ICD-10-CM | POA: Diagnosis not present

## 2018-11-05 NOTE — Therapy (Signed)
[facility] [address] [place], [place], [address] Phone: [phone]   Fax:  [phone]  Physical Therapy Treatment  Patient Details  Name: Steve Maxwell MRN: [id number] Date of Birth: January 19, 1956 Referring Provider (PT): Dr. Ramond Marrowax Varkey   Encounter Date: 11/05/2018  PT End of Session - 11/05/18 1141    Visit Number  2    Number of Visits  24    Date for PT Re-Evaluation  01/26/19    Authorization Type  Aetna    Authorization - Number of Visits  60    PT Start Time  1149    PT Stop Time  1238    PT Time Calculation (min)  49 min    Activity Tolerance  Patient tolerated treatment well    Behavior During Therapy  [facility] for tasks assessed/performed       Past Medical History:  Diagnosis Date  . 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 HYPERTROPHY PROSTATE W/O UR OBST & OTH LUTS 01/11/2010   Qualifier: Diagnosis of  By: Linford ArnoldMetheney MD, Santina Evansatherine      Past Surgical History:  Procedure Laterality Date  . ANKLE SURGERY     Right   . BACK SURGERY  2008  . CARPAL TUNNEL RELEASE     right  . ELBOW SURGERY  2-09   Left and right  . KNEE SURGERY     right x 2  . KNEE SURGERY     Left   . PLANTAR FASCIA RELEASE     Right   . right rotator cuff surgery      There were no vitals filed for this visit.  Subjective Assessment - 11/05/18 1153    Subjective  Pt reports he has some stiffness in Rt shoulder, other than that no other complaints or changes.     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  No/denies    Pain Score  0-No pain         [facility] PT Assessment - 11/05/18 0001      Assessment   Medical Diagnosis  Rt RTC repair    Referring Provider (PT)  Dr. Ramond Marrowax Varkey    Onset Date/Surgical Date  10/19/18    Hand Dominance  Right    Next MD Visit  12/01/18    Prior Therapy  none (2nd RTC repair on Rt)       [facility]  Adult PT Treatment/Exercise - 11/05/18 0001      Shoulder Exercises: Seated   Other Seated Exercises  AROM exercises per HEP: elbow flex/ext x 15; pronation/supination x 15; wrist flex/ext x 15; grip strength with Rt hand, arm in neutral position - 10x5 sec    Other Seated Exercises  shoulder rolls forward/ backward - 10 each side.       Shoulder Exercises: Standing   Other Standing Exercises  Pendulums per HEP:  including flex/ext, side to side, and circles, for Rt shoulder, cues for technique.       Vasopneumatic   Number Minutes Vasopneumatic   15 minutes    Vasopnuematic Location   Shoulder   Rt   Vasopneumatic Pressure  Low    Vasopneumatic Temperature   34 deg       Manual Therapy   Manual Therapy  Passive ROM;Soft tissue mobilization    Manual therapy comments  pt in supported supine  Soft tissue mobilization  gentle STM to Rt pec, levator, upper trap    Passive ROM  Rt shoulder within protocol limitations all motions; max cues to relax and decrease guarding       PT Short Term Goals - 11/03/18 1250      PT SHORT TERM GOAL #1   Title  independent with intial HEP    Status  New    Target Date  12/01/18      PT SHORT TERM GOAL #2   Title  Rt shoulder PROM to protocol limits in order to progress into next phase    Status  New    Target Date  12/01/18        PT Long Term Goals - 11/03/18 1252      PT LONG TERM GOAL #1   Title  independent with advanced HEP    Status  New    Target Date  01/26/19      PT LONG TERM GOAL #2   Title  Rt shoulder AROM within 3-5 degrees of Lt shoulder for improved ROM and function    Status  New    Target Date  01/26/19      PT LONG TERM GOAL #3   Title  tolerate strengthening exercises with pain < 3/10 for improved function    Status  New    Target Date  01/26/19      PT LONG TERM GOAL #4   Title  report ability to perform ADLs without modifications for improved function    Status  New    Target Date  01/26/19             Plan - 11/05/18 1236    Clinical Impression Statement  Pt guarded with PROM, requiring continuous cues to remain passive.  PROM performed within protocol limits and within tolerance.  Goals are ongoing.     Rehab Potential  Good    PT Frequency  2x / week    PT Duration  12 weeks    PT Treatment/Interventions  ADLs/Self Care Home Management;Cryotherapy;Electrical Stimulation;Moist Heat;Ultrasound;Therapeutic activities;Therapeutic exercise;Neuromuscular re-education;Manual techniques;Patient/family education;Passive range of motion;Vasopneumatic Device;Taping    PT Next Visit Plan  review HEP, PROM (no AAROM until 6 weeks); manual/modalities    PT Home Exercise Plan  Access Code: [email or web address]    Consulted and Agree with Plan of Care  Patient       Patient will benefit from skilled therapeutic intervention in order to improve the following deficits and impairments:  Pain, Postural dysfunction, Decreased range of motion, Decreased strength, Decreased scar mobility, Impaired UE functional use  Visit Diagnosis: Acute pain of right shoulder  Stiffness of right shoulder, not elsewhere classified  Abnormal posture  Muscle weakness (generalized)     Problem List Patient Active Problem List   Diagnosis Date Noted  . Primary osteoarthritis of right foot 08/12/2018  . 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 BPH with obstruction/lower urinary tract symptoms 03/23/2013  . Osteoarthritis of right knee 12/04/2012  . Osteoarthritis, bilateral hands 12/04/2012  . Right shoulder pain 10/09/2012  . 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 Insomnia 10/09/2012  . Obesity 12/12/2011  . Hypogonadism male 03/20/2011  . ED (erectile dysfunction) 02/18/2011  . HYPERTROPHY PROSTATE W/O UR OBST & OTH LUTS 01/11/2010  . Lumbar degenerative disc disease 01/11/2010  . HEADACHE 10/16/2009   Mayer CamelJennifer Carlson-Long, PTA 11/05/18 12:56 PM  [facility] [address] [place], [place], [address] Phone: [phone]   Fax:  [phone]  Name: Steve Maxwell MRN: [id number] Date of Birth: 1956-09-29

## 2018-11-09 ENCOUNTER — Ambulatory Visit (INDEPENDENT_AMBULATORY_CARE_PROVIDER_SITE_OTHER): Payer: 59 | Admitting: Physical Therapy

## 2018-11-09 DIAGNOSIS — M25511 Pain in right shoulder: Secondary | ICD-10-CM

## 2018-11-09 DIAGNOSIS — M25611 Stiffness of right shoulder, not elsewhere classified: Secondary | ICD-10-CM

## 2018-11-09 DIAGNOSIS — M6281 Muscle weakness (generalized): Secondary | ICD-10-CM | POA: Diagnosis not present

## 2018-11-09 DIAGNOSIS — R293 Abnormal posture: Secondary | ICD-10-CM

## 2018-11-09 NOTE — Therapy (Signed)
[facility] [place] [address] [place], [place], [address] Phone: [phone]   Fax:  [phone]  Physical Therapy Treatment  Patient Details  Name: Steve Maxwell MRN: [id number] Date of Birth: 1956/05/22 Referring Provider (PT): Dr. Ramond Marrow   Encounter Date: 11/09/2018  PT End of Session - 11/09/18 1228    Visit Number  3    Number of Visits  24    Date for PT Re-Evaluation  01/26/19    Authorization Type  Aetna    Authorization - Number of Visits  60    PT Start Time  1152    PT Stop Time  1240    PT Time Calculation (min)  48 min    Activity Tolerance  Patient tolerated treatment well    Behavior During Therapy  [facility] for tasks assessed/performed       Past Medical History:  Diagnosis Date  . 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 HYPERTROPHY PROSTATE W/O UR OBST & OTH LUTS 01/11/2010   Qualifier: Diagnosis of  By: Linford Arnold MD, Santina Evans      Past Surgical History:  Procedure Laterality Date  . ANKLE SURGERY     Right   . BACK SURGERY  2008  . CARPAL TUNNEL RELEASE     right  . ELBOW SURGERY  2-09   Left and right  . KNEE SURGERY     right x 2  . KNEE SURGERY     Left   . PLANTAR FASCIA RELEASE     Right   . right rotator cuff surgery      There were no vitals filed for this visit.  Subjective Assessment - 11/09/18 1243    Subjective  Pt reports no new changes since last visit, other than feeling anxious to return to working out. He is staying cautious to avoid reinjury.    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  No/denies    Pain Score  0-No pain         [facility] PT Assessment - 11/09/18 0001      Assessment   Medical Diagnosis  Rt RTC repair    Referring Provider (PT)  Dr. Ramond Marrow    Onset Date/Surgical Date  10/19/18    Hand Dominance  Right    Next MD Visit  12/01/18    Prior Therapy  none  (2nd RTC repair on Rt)        [facility] Adult PT Treatment/Exercise - 11/09/18 0001      Exercises   Exercises  Wrist      Elbow Exercises   Elbow Flexion  Both;20 reps;AROM   shoulder in neutral     Shoulder Exercises: Seated   Other Seated Exercises  AROM for wrist flex/ext and forearm pronation/supination with 5 sec holds at end ranges x 15 each.     Other Seated Exercises  shoulder rolls forward/ backward - 15 each side.       Shoulder Exercises: Standing   Retraction  Both;10 reps   arm in neutral position    Retraction Limitations  back against pool noodle for feedback; 5 sec holds.     Other Standing Exercises  Pendulums per HEP:  including flex/ext, side to side, and circles, for Rt shoulder, cues for technique.       Vasopneumatic   Number Minutes Vasopneumatic   15  minutes    Vasopnuematic Location   Shoulder   Rt   Vasopneumatic Pressure  Low    Vasopneumatic Temperature   34 deg       Manual Therapy   Manual Therapy  Passive ROM;Soft tissue mobilization;Scapular mobilization    Manual therapy comments  pt in supported supine     Soft tissue mobilization  gentle STM to Rt pec, levator, upper trap    Scapular Mobilization  Rt scapula in all directions    Passive ROM  Rt shoulder within protocol limitations all motions; cues to relax and decrease guarding with flexion.                PT Short Term Goals - 11/03/18 1250      PT SHORT TERM GOAL #1   Title  independent with intial HEP    Status  New    Target Date  12/01/18      PT SHORT TERM GOAL #2   Title  Rt shoulder PROM to protocol limits in order to progress into next phase    Status  New    Target Date  12/01/18        PT Long Term Goals - 11/03/18 1252      PT LONG TERM GOAL #1   Title  independent with advanced HEP    Status  New    Target Date  01/26/19      PT LONG TERM GOAL #2   Title  Rt shoulder AROM within 3-5 degrees of Lt shoulder for improved ROM and function    Status  New     Target Date  01/26/19      PT LONG TERM GOAL #3   Title  tolerate strengthening exercises with pain < 3/10 for improved function    Status  New    Target Date  01/26/19      PT LONG TERM GOAL #4   Title  report ability to perform ADLs without modifications for improved function    Status  New    Target Date  01/26/19            Plan - 11/09/18 1244    Clinical Impression Statement  Pt much less guarded with PROM today in ER and scaption; able to reach limits of protocol (40 deg ER, 80 deg scaption). Pt somewhat guarded with PROM into flexion; near meeting 120 deg limit of protocol.  Pt tolerated treatment well without any increase in pain.  Pt is near meeting STG.      Rehab Potential  Good    PT Frequency  2x / week    PT Duration  12 weeks    PT Treatment/Interventions  ADLs/Self Care Home Management;Cryotherapy;Electrical Stimulation;Moist Heat;Ultrasound;Therapeutic activities;Therapeutic exercise;Neuromuscular re-education;Manual techniques;Patient/family education;Passive range of motion;Vasopneumatic Device;Taping    PT Next Visit Plan  review HEP, PROM (no AAROM until 6 weeks); manual/modalities    PT Home Exercise Plan  Access Code: [email or web address]    Consulted and Agree with Plan of Care  Patient       Patient will benefit from skilled therapeutic intervention in order to improve the following deficits and impairments:  Pain, Postural dysfunction, Decreased range of motion, Decreased strength, Decreased scar mobility, Impaired UE functional use  Visit Diagnosis: Acute pain of right shoulder  Stiffness of right shoulder, not elsewhere classified  Abnormal posture  Muscle weakness (generalized)     Problem List Patient Active Problem List   Diagnosis Date Noted  . Primary  osteoarthritis of right foot 08/12/2018  . 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 BPH with obstruction/lower urinary tract symptoms 03/23/2013  .  Osteoarthritis of right knee 12/04/2012  . Osteoarthritis, bilateral hands 12/04/2012  . Right shoulder pain 10/09/2012  . 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 Insomnia 10/09/2012  . Obesity 12/12/2011  . Hypogonadism male 03/20/2011  . ED (erectile dysfunction) 02/18/2011  . HYPERTROPHY PROSTATE W/O UR OBST & OTH LUTS 01/11/2010  . Lumbar degenerative disc disease 01/11/2010  . HEADACHE 10/16/2009   Mayer Camel, PTA 11/09/18 1:02 PM  [facility] [place] [address] [place], [place], [address] Phone: [phone]   Fax:  [phone]  Name: Steve Maxwell MRN: [id number] Date of Birth: [birth year]

## 2018-11-12 ENCOUNTER — Ambulatory Visit (INDEPENDENT_AMBULATORY_CARE_PROVIDER_SITE_OTHER): Payer: Self-pay | Admitting: Physical Therapy

## 2018-11-12 ENCOUNTER — Encounter: Payer: Self-pay | Admitting: Physical Therapy

## 2018-11-12 DIAGNOSIS — M6281 Muscle weakness (generalized): Secondary | ICD-10-CM

## 2018-11-12 DIAGNOSIS — M25511 Pain in right shoulder: Secondary | ICD-10-CM

## 2018-11-12 DIAGNOSIS — M25611 Stiffness of right shoulder, not elsewhere classified: Secondary | ICD-10-CM

## 2018-11-12 DIAGNOSIS — R293 Abnormal posture: Secondary | ICD-10-CM

## 2018-11-12 NOTE — Therapy (Signed)
[facility] [place] [address] [place], [place], [address] Phone: [phone]   Fax:  [phone]  Physical Therapy Treatment  Patient Details  Name: Steve Maxwell MRN: [id number] Date of Birth: 1956-01-16 Referring Provider (PT): Dr. Ramond Marrow   Encounter Date: 11/12/2018  PT End of Session - 11/12/18 1107    Visit Number  4    Number of Visits  24    Date for PT Re-Evaluation  01/26/19    Authorization Type  Aetna    Authorization - Number of Visits  60    PT Start Time  1030    Activity Tolerance  Patient tolerated treatment well    Behavior During Therapy  [facility] for tasks assessed/performed       Past Medical History:  Diagnosis Date  . 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 HYPERTROPHY PROSTATE W/O UR OBST & OTH LUTS 01/11/2010   Qualifier: Diagnosis of  By: Linford Arnold MD, Santina Evans      Past Surgical History:  Procedure Laterality Date  . ANKLE SURGERY     Right   . BACK SURGERY  2008  . CARPAL TUNNEL RELEASE     right  . ELBOW SURGERY  2-09   Left and right  . KNEE SURGERY     right x 2  . KNEE SURGERY     Left   . PLANTAR FASCIA RELEASE     Right   . right rotator cuff surgery      There were no vitals filed for this visit.  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  No/denies         [facility] PT Assessment - 11/12/18 1107      Assessment   Medical Diagnosis  Rt RTC repair    Referring Provider (PT)  Dr. Ramond Marrow    Onset Date/Surgical Date  10/19/18    Hand Dominance  Right      PROM   Right Shoulder Flexion  --   not measured today; ~ 120 degrees   Right Shoulder ABduction  80 Degrees    Right Shoulder External Rotation  40 Degrees                   [facility] Adult PT Treatment/Exercise - 11/12/18  1044      Elbow Exercises   Elbow Flexion  20 reps;AROM;Right   shoulder in neutral     Shoulder Exercises: Seated   Other Seated Exercises  AROM for wrist flex/ext and forearm pronation/supination with 5 sec holds at end ranges x 20 each. stress ball grip squeeze 20x5 sec; pincer/finger strength medium tension x 20 reps    Other Seated Exercises  shoulder rolls forward/ backward - 20 each side.       Vasopneumatic   Number Minutes Vasopneumatic   15 minutes    Vasopnuematic Location   Shoulder    Vasopneumatic Pressure  Low    Vasopneumatic Temperature   34 deg       Manual Therapy   Manual Therapy  Soft tissue mobilization;Passive ROM    Soft tissue mobilization  gentle STM to Rt pec, upper trap, deltoids and scar massage    Passive ROM  Rt shoulder within protocol limitations all motions; improved ability  to relax and decreased guarding noted               PT Short Term Goals - 11/12/18 1108      PT SHORT TERM GOAL #1   Title  independent with intial HEP    Status  Achieved      PT SHORT TERM GOAL #2   Title  Rt shoulder PROM to protocol limits in order to progress into next phase    Baseline  11/12/18: will formally measure flexion next visit, others to max of protocol limitations    Status  On-going    Target Date  12/01/18        PT Long Term Goals - 11/03/18 1252      PT LONG TERM GOAL #1   Title  independent with advanced HEP    Status  New    Target Date  01/26/19      PT LONG TERM GOAL #2   Title  Rt shoulder AROM within 3-5 degrees of Lt shoulder for improved ROM and function    Status  New    Target Date  01/26/19      PT LONG TERM GOAL #3   Title  tolerate strengthening exercises with pain < 3/10 for improved function    Status  New    Target Date  01/26/19      PT LONG TERM GOAL #4   Title  report ability to perform ADLs without modifications for improved function    Status  New    Target Date  01/26/19            Plan - 11/12/18  1108    Clinical Impression Statement  Pt tolerated session well today, and anticipate pt has reached PROM to max of protocol at this time.  May consider decreased freq to 1x/wk until able to progress protocol, but will monitor for PROM changes and adjust freq as needed.  Progressing well with PT.    Rehab Potential  Good    PT Frequency  2x / week    PT Duration  12 weeks    PT Treatment/Interventions  ADLs/Self Care Home Management;Cryotherapy;Electrical Stimulation;Moist Heat;Ultrasound;Therapeutic activities;Therapeutic exercise;Neuromuscular re-education;Manual techniques;Patient/family education;Passive range of motion;Vasopneumatic Device;Taping    PT Next Visit Plan  measure PROM, consider decr freq to 1x/wk, goals to maintain ROM and continue elbow/wrist ROM, grip strengthening    PT Home Exercise Plan  Access Code: [email or web address]    Consulted and Agree with Plan of Care  Patient       Patient will benefit from skilled therapeutic intervention in order to improve the following deficits and impairments:  Pain, Postural dysfunction, Decreased range of motion, Decreased strength, Decreased scar mobility, Impaired UE functional use  Visit Diagnosis: Acute pain of right shoulder  Stiffness of right shoulder, not elsewhere classified  Abnormal posture  Muscle weakness (generalized)     Problem List Patient Active Problem List   Diagnosis Date Noted  . Primary osteoarthritis of right foot 08/12/2018  . 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 BPH with obstruction/lower urinary tract symptoms 03/23/2013  . Osteoarthritis of right knee 12/04/2012  . Osteoarthritis, bilateral hands 12/04/2012  . Right shoulder pain 10/09/2012  . 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 Insomnia 10/09/2012  . Obesity 12/12/2011  . Hypogonadism male 03/20/2011  . ED (erectile dysfunction) 02/18/2011  . HYPERTROPHY PROSTATE W/O UR OBST & OTH LUTS  01/11/2010  . Lumbar degenerative disc disease  01/11/2010  . HEADACHE 10/16/2009      Clarita CraneStephanie F Anthonia Monger, PT, DPT 11/12/18 11:11 AM    [facility] [address] [place], [place], [address] Phone: [phone]   Fax:  [phone]  Name: Jeoffrey MassedLonnie R Crego MRN: [id number] Date of Birth: September 20, 1956

## 2018-11-16 ENCOUNTER — Ambulatory Visit (INDEPENDENT_AMBULATORY_CARE_PROVIDER_SITE_OTHER): Payer: Self-pay | Admitting: Physical Therapy

## 2018-11-16 ENCOUNTER — Encounter: Payer: Self-pay | Admitting: Physical Therapy

## 2018-11-16 DIAGNOSIS — M6281 Muscle weakness (generalized): Secondary | ICD-10-CM

## 2018-11-16 DIAGNOSIS — M25611 Stiffness of right shoulder, not elsewhere classified: Secondary | ICD-10-CM

## 2018-11-16 DIAGNOSIS — M25511 Pain in right shoulder: Secondary | ICD-10-CM

## 2018-11-16 DIAGNOSIS — R293 Abnormal posture: Secondary | ICD-10-CM

## 2018-11-16 NOTE — Therapy (Addendum)
[place] [address] [place], [place], [address] Phone: [phone]   Fax:  [phone]  Physical Therapy Treatment  Patient Details  Name: LLEWELYN SHEAFFER MRN: [id number] Date of Birth: February 02, 1956 Referring Provider (PT): Dr. Ophelia Charter   Encounter Date: 11/16/2018  PT End of Session - 11/16/18 1234    Visit Number  5    Number of Visits  24    Date for PT Re-Evaluation  01/26/19    Authorization Type  [employer]    Authorization - Number of Visits  60    PT Start Time  1145    PT Stop Time  1230    PT Time Calculation (min)  45 min    Activity Tolerance  Patient tolerated treatment well    Behavior During Therapy  [facility] for tasks assessed/performed       Past Medical History:  Diagnosis Date  . 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 ANKLE SURGERY     Right   . BACK SURGERY  2008  . CARPAL TUNNEL RELEASE     right  . ELBOW SURGERY  2-09   Left and right  . KNEE SURGERY     right x 2  . KNEE SURGERY     Left   . PLANTAR FASCIA RELEASE     Right   . right rotator cuff surgery      There were no vitals filed for this visit.  Subjective Assessment - 11/16/18 1159    Subjective  doing well    Patient Stated Goals  return to weight lifting, be able to return to being umpire for [employer]    Currently in Pain?  No/denies         [facility] PT Assessment - 11/16/18 1209      Assessment   Medical Diagnosis  Rt RTC repair    Referring Provider (PT)  Dr. Ophelia Charter    Onset Date/Surgical Date  10/19/18    Hand Dominance  Right    Next MD Visit  12/01/18      PROM   Right Shoulder Flexion  140 Degrees    Right Shoulder ABduction  80 Degrees    Right Shoulder External Rotation  40 Degrees                    [facility] Adult PT Treatment/Exercise - 11/16/18 1159      Elbow Exercises   Elbow Flexion  20 reps;AROM;Right      Shoulder Exercises: Seated   Other Seated Exercises  AROM for wrist flex/ext and forearm pronation/supination with 5 sec holds at end ranges x 20 each. stress ball grip squeeze 20x5 sec; pincer/finger strength medium tension x 20 reps    Other Seated Exercises  shoulder rolls forward/ backward - 20 each side.       Vasopneumatic   Number Minutes Vasopneumatic   15 minutes    Vasopnuematic Location   Shoulder    Vasopneumatic Pressure  Low    Vasopneumatic Temperature   34 deg       Manual Therapy   Soft tissue mobilization  gentle STM to Rt pec, upper trap, deltoids and scar massage    Passive ROM  Rt shoulder within protocol limitations all  motions; improved ability to relax and decreased guarding noted               PT Short Term Goals - 11/16/18 1235      PT SHORT TERM GOAL #1   Title  independent with intial HEP    Status  Achieved      PT SHORT TERM GOAL #2   Title  Rt shoulder PROM to protocol limits in order to progress into next phase    Baseline  11/12/18: will formally measure flexion next visit, others to max of protocol limitations    Status  Achieved        PT Long Term Goals - 11/03/18 1252      PT LONG TERM GOAL #1   Title  independent with advanced HEP    Status  New    Target Date  01/26/19      PT LONG TERM GOAL #2   Title  Rt shoulder AROM within 3-5 degrees of Lt shoulder for improved ROM and function    Status  New    Target Date  01/26/19      PT LONG TERM GOAL #3   Title  tolerate strengthening exercises with pain < 3/10 for improved function    Status  New    Target Date  01/26/19      PT LONG TERM GOAL #4   Title  report ability to perform ADLs without modifications for improved function    Status  New    Target Date  01/26/19            Plan - 11/16/18 1235    Clinical Impression  Statement  Pt has met both STGs and is doing very well within limits of protocol.  Plan to decrease frequency to 1x/wk to focus on maintaining PROM until he is able to move into next phase of protocol.  Will continue to benefit from PT to maximize function.    Rehab Potential  Good    PT Frequency  2x / week    PT Duration  12 weeks    PT Treatment/Interventions  ADLs/Self Care Home Management;Cryotherapy;Electrical Stimulation;Moist Heat;Ultrasound;Therapeutic activities;Therapeutic exercise;Neuromuscular re-education;Manual techniques;Patient/family education;Passive range of motion;Vasopneumatic Device;Taping    PT Next Visit Plan   goals to maintain ROM and continue elbow/wrist ROM, grip strengthening    PT Home Exercise Plan  Access Code: [email or web address]    Consulted and Agree with Plan of Care  Patient       Patient will benefit from skilled therapeutic intervention in order to improve the following deficits and impairments:  Pain, Postural dysfunction, Decreased range of motion, Decreased strength, Decreased scar mobility, Impaired UE functional use  Visit Diagnosis: Acute pain of right shoulder  Stiffness of right shoulder, not elsewhere classified  Abnormal posture  Muscle weakness (generalized)     Problem List Patient Active Problem List   Diagnosis Date Noted  . Primary osteoarthritis of right foot 08/12/2018  . 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 BPH with obstruction/lower urinary tract symptoms 03/23/2013  . Osteoarthritis of right knee 12/04/2012  . Osteoarthritis, bilateral hands 12/04/2012  . Right shoulder pain 10/09/2012  . 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 Insomnia 10/09/2012  . Obesity 12/12/2011  . Hypogonadism male 03/20/2011  . ED (erectile dysfunction) 02/18/2011  . HYPERTROPHY PROSTATE W/O UR OBST & OTH LUTS 01/11/2010  . Lumbar degenerative disc disease 01/11/2010  . HEADACHE 10/16/2009  Laureen Abrahams, PT, DPT 11/16/18 12:37 PM     [place], [place], [address] Phone: [phone]   Fax:  [phone]  Name: MAJESTIC BRISTER MRN: [id number] Date of Birth: 08-Feb-1956     PHYSICAL THERAPY DISCHARGE SUMMARY  Visits from Start of Care: 5  Current functional level related to goals / functional outcomes: See above   Remaining deficits: See above; pt had to cx remaining visits due to losing insurance   Education / Equipment: HEP  Plan: Patient agrees to discharge.  Patient goals were not met. Patient is being discharged due to financial reasons.  ?????     Laureen Abrahams, PT, DPT 01/05/19 2:46 PM  [facility] [address] [place], [place] [address]  [phone] (office) [phone] (fax)

## 2018-11-19 ENCOUNTER — Encounter: Payer: 59 | Admitting: Physical Therapy

## 2018-11-23 ENCOUNTER — Encounter: Payer: 59 | Admitting: Physical Therapy

## 2018-11-26 ENCOUNTER — Encounter: Payer: 59 | Admitting: Physical Therapy

## 2018-11-30 ENCOUNTER — Encounter: Payer: Self-pay | Admitting: Physical Therapy

## 2018-12-22 HISTORY — PX: SHOULDER ARTHROSCOPY WITH ROTATOR CUFF REPAIR AND SUBACROMIAL DECOMPRESSION: SHX5686

## 2019-01-03 ENCOUNTER — Other Ambulatory Visit: Payer: Self-pay | Admitting: Sports Medicine

## 2019-01-03 DIAGNOSIS — L603 Nail dystrophy: Secondary | ICD-10-CM

## 2019-01-11 ENCOUNTER — Encounter: Payer: Self-pay | Admitting: *Deleted

## 2019-03-22 ENCOUNTER — Ambulatory Visit (INDEPENDENT_AMBULATORY_CARE_PROVIDER_SITE_OTHER): Payer: BC Managed Care – PPO | Admitting: Sports Medicine

## 2019-03-22 ENCOUNTER — Telehealth: Payer: Self-pay | Admitting: Family Medicine

## 2019-03-22 ENCOUNTER — Other Ambulatory Visit: Payer: Self-pay

## 2019-03-22 ENCOUNTER — Encounter: Payer: Self-pay | Admitting: Sports Medicine

## 2019-03-22 DIAGNOSIS — M170 Bilateral primary osteoarthritis of knee: Secondary | ICD-10-CM | POA: Diagnosis not present

## 2019-03-22 DIAGNOSIS — M2550 Pain in unspecified joint: Secondary | ICD-10-CM

## 2019-03-22 DIAGNOSIS — M19071 Primary osteoarthritis, right ankle and foot: Secondary | ICD-10-CM | POA: Diagnosis not present

## 2019-03-22 MED ORDER — DICLOFENAC SODIUM 75 MG PO TBEC: 75.0000 mg | DELAYED_RELEASE_TABLET | Freq: Two times a day (BID) | ORAL | 3 refills | Status: AC

## 2019-03-22 MED ORDER — TADALAFIL 5 MG PO TABS: ORAL_TABLET | ORAL | 0 refills | Status: AC

## 2019-03-22 NOTE — Assessment & Plan Note (Signed)
Switching to Voltaren, adding rheumatoid testing.

## 2019-03-22 NOTE — Assessment & Plan Note (Signed)
Switching to Voltaren, hopefully we get some systemic effect from the steroid injected into the knees. Continue custom orthotics.

## 2019-03-22 NOTE — Telephone Encounter (Signed)
Received fax from [facility] that Tadalafil was approved from 03/22/2019 through 03/21/2022. Pharmacy aware.

## 2019-03-22 NOTE — Progress Notes (Signed)
Subjective:    CC: Foot pain, knee pain  HPI: Steve Maxwell is a pleasant 63 year old male with bilateral knee osteoarthritis, he last had an injection in the right knee in 2014, did well until recently.  Now having a recurrence of pain, moderate, persistent, localized at the joint line without radiation.  In addition he has pain over the dorsum of his foot, moderate, persistent, localized without radiation, we diagnosed him with foot osteoarthritis in the past, he did well with custom orthotics.  On further questioning he does have significant morning stiffness, gelling, and polyarthralgia, has never been tested for rheumatoid arthritis.  I reviewed the past medical history, family history, social history, surgical history, and allergies today and no changes were needed.  Please see the problem list section below in epic for further details.  Past Medical History: Past Medical History:  Diagnosis Date  . 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 HYPERTROPHY PROSTATE W/O UR OBST & OTH LUTS 01/11/2010   Qualifier: Diagnosis of  By: Linford Arnold MD, Santina Evans     Past Surgical History: Past Surgical History:  Procedure Laterality Date  . ANKLE SURGERY     Right   . BACK SURGERY  2008  . CARPAL TUNNEL RELEASE     right  . ELBOW SURGERY  2-09   Left and right  . KNEE SURGERY     right x 2  . KNEE SURGERY     Left   . PLANTAR FASCIA RELEASE     Right   . right rotator cuff surgery    . SHOULDER ARTHROSCOPY WITH ROTATOR CUFF REPAIR AND SUBACROMIAL DECOMPRESSION Right 12/22/2018   Dr. Everardo Pacific   Social History: Social History   Socioeconomic History  . Marital status: Divorced    Spouse name: Not on file  . Number of children: 2  . Years of education: Not on file  . Highest education level: Not on file  Occupational History  . Occupation: IT    Comment: Lexicographer  Social Needs  . Financial resource strain: Not on file  . Food insecurity:    Worry: Not on file     Inability: Not on file  . Transportation needs:    Medical: Not on file    Non-medical: Not on file  Tobacco Use  . Smoking status: Never Smoker  . Smokeless tobacco: Never Used  Substance and Sexual Activity  . Alcohol use: No  . Drug use: No  . Sexual activity: Not on file  Lifestyle  . Physical activity:    Days per week: Not on file    Minutes per session: Not on file  . Stress: Not on file  Relationships  . Social connections:    Talks on phone: Not on file    Gets together: Not on file    Attends religious service: Not on file    Active member of club or organization: Not on file    Attends meetings of clubs or organizations: Not on file    Relationship status: Not on file  Other Topics Concern  . Not on file  Social History Narrative   Working out every day of  The week.    Family History: Family History  Problem Relation Age of Onset  . Alcohol abuse Mother   . Diabetes Father   . Stroke Father   . Thyroid disease Father   . Other Father        back problems  . Kidney disease Father  Renal failure  . Cancer Other    Allergies: Allergies  Allergen Reactions  . Avodart [Dutasteride] Other (See Comments)    Sexual dysfunction  . Viagra [Sildenafil Citrate] Nausea Only    Headaches   Medications: See med rec.  Review of Systems: No fevers, chills, night sweats, weight loss, chest pain, or shortness of breath.   Objective:    General: Well Developed, well nourished, and in no acute distress.  Neuro: Alert and oriented x3, extra-ocular muscles intact, sensation grossly intact.  HEENT: Normocephalic, atraumatic, pupils equal round reactive to light, neck supple, no masses, no lymphadenopathy, thyroid nonpalpable.  Skin: Warm and dry, no rashes. Cardiac: Regular rate and rhythm, no murmurs rubs or gallops, no lower extremity edema.  Respiratory: Clear to auscultation bilaterally. Not using accessory muscles, speaking in full sentences. Bilateral  knees: Well-healed surgical scars, minimally full, tender at the joint lines. ROM normal in flexion and extension and lower leg rotation. Ligaments with solid consistent endpoints including ACL, PCL, LCL, MCL. Negative Mcmurray's and provocative meniscal tests. Non painful patellar compression. Patellar and quadriceps tendons unremarkable. Hamstring and quadriceps strength is normal.  Procedure: Real-time Ultrasound Guided injection of the left knee Device: GE Logiq E  Verbal informed consent obtained.  Time-out conducted.  Noted no overlying erythema, induration, or other signs of local infection.  Skin prepped in a sterile fashion.  Local anesthesia: Topical Ethyl chloride.  With sterile technique and under real time ultrasound guidance:  1 cc Kenalog 40, 2 cc lidocaine, 2 cc bupivacaine injected easily Completed without difficulty  Pain immediately resolved suggesting accurate placement of the medication.  Advised to call if fevers/chills, erythema, induration, drainage, or persistent bleeding.  Images permanently stored and available for review in the ultrasound unit.  Impression: Technically successful ultrasound guided injection.  Procedure: Real-time Ultrasound Guided injection of the right knee Device: GE Logiq E  Verbal informed consent obtained.  Time-out conducted.  Noted no overlying erythema, induration, or other signs of local infection.  Skin prepped in a sterile fashion.  Local anesthesia: Topical Ethyl chloride.  With sterile technique and under real time ultrasound guidance:  1 cc Kenalog 40, 2 cc lidocaine, 2 cc bupivacaine injected easily Completed without difficulty  Pain immediately resolved suggesting accurate placement of the medication.  Advised to call if fevers/chills, erythema, induration, drainage, or persistent bleeding.  Images permanently stored and available for review in the ultrasound unit.  Impression: Technically successful ultrasound guided  injection.  Impression and Recommendations:    Primary osteoarthritis of both knees Bilateral injection today, previous injection was in 2014. Viscosupplementation if no better in a month.  Primary osteoarthritis of right foot Switching to Voltaren, hopefully we get some systemic effect from the steroid injected into the knees. Continue custom orthotics.  Polyarthralgia Switching to Voltaren, adding rheumatoid testing.   ___________________________________________ Ihor Austinhomas J. Benjamin Stainhekkekandam, M.D., ABFM., CAQSM. Primary Care and Sports Medicine [facility]  Adjunct Professor of Family Medicine  [employer]

## 2019-03-22 NOTE — Telephone Encounter (Signed)
Patient advised to schedule a follow up appointment.  

## 2019-03-22 NOTE — Assessment & Plan Note (Signed)
Bilateral injection today, previous injection was in 2014. Viscosupplementation if no better in a month.

## 2019-03-25 ENCOUNTER — Ambulatory Visit (INDEPENDENT_AMBULATORY_CARE_PROVIDER_SITE_OTHER): Payer: BC Managed Care – PPO | Admitting: Family Medicine

## 2019-03-25 ENCOUNTER — Encounter: Payer: Self-pay | Admitting: Family Medicine

## 2019-03-25 VITALS — Ht 72.0 in

## 2019-03-25 DIAGNOSIS — R066 Hiccough: Secondary | ICD-10-CM

## 2019-03-25 DIAGNOSIS — M5136 Other intervertebral disc degeneration, lumbar region: Secondary | ICD-10-CM

## 2019-03-25 MED ORDER — CYCLOBENZAPRINE HCL 10 MG PO TABS: 10.0000 mg | ORAL_TABLET | Freq: Every evening | ORAL | 3 refills | Status: AC | PRN

## 2019-03-25 MED ORDER — PANTOPRAZOLE SODIUM 40 MG PO TBEC: 40.0000 mg | DELAYED_RELEASE_TABLET | Freq: Every day | ORAL | 1 refills | Status: DC

## 2019-03-25 NOTE — Patient Instructions (Signed)
Cal if not better by the weekend.

## 2019-03-25 NOTE — Progress Notes (Signed)
Virtual Visit via Video Note  I connected with Steve Maxwell on 03/25/19 at  9:30 AM EDT by a video enabled telemedicine application and verified that I am speaking with the correct person using two identifiers.   I discussed the limitations of evaluation and management by telemedicine and the availability of in person appointments. The patient expressed understanding and agreed to proceed.  Pt was at home and I was in my office for the virtual visit.     Subjective:    CC:   HPI:  He reports that he has been having increased hiccups mainly after eating x 2 days. It will subside after some time but will come back he has tried the OTC Gas-x which has helped some.  He is not currently on any type of reflux medication or PPI.  He denies any recent head trauma or neck trauma.  He denies any recent upper respiratory symptoms or fever or chills.  No chest pain or shortness of breath he says he still actively exercising and working out and has not noticed any changes.  He denies any constipation or changes in his bowel regimen.  All of his medications are the same.  He does take a multivitamin and fish oil but says these are not new and he has not changed brands recently.  Lumbar degenerative disc disease-he would like a refill on his muscle relaxer.  He says he works out a lot and just likes having the muscle relaxer to help him if needed.  He does not take it every night.   Past medical history, Surgical history, Family history not pertinant except as noted below, Social history, Allergies, and medications have been entered into the medical record, reviewed, and corrections made.   Review of Systems: No fevers, chills, night sweats, weight loss, chest pain, or shortness of breath.   Objective:    General: Speaking clearly in complete sentences without any shortness of breath.  Alert and oriented x3.  Normal judgment. No apparent acute distress. Well groomed.     Impression and  Recommendations:    Hiccups -I am not sure what triggered this over the last couple of days it does not sound like there is any red flag symptoms for head injury trauma tumor MI etc.  Does not sound like he has had any significant changes in diet recently.  Though he says he has been eating a little bit more healthy.  No major changes in medications that could be triggering this.  We will try putting him on a PPI first through the weekend.  If he is not improving then consider potential additional work-up and a trial of either baclofen or gabapentin.  Lumbar DDD -did refill the Flexeril.      I discussed the assessment and treatment plan with the patient. The patient was provided an opportunity to ask questions and all were answered. The patient agreed with the plan and demonstrated an understanding of the instructions.   The patient was advised to call back or seek an in-person evaluation if the symptoms worsen or if the condition fails to improve as anticipated.   Nani Gasser, MD

## 2019-03-25 NOTE — Progress Notes (Signed)
He reports that he has been having increased hiccups mainly after eating x 2 days. It will subside after some time but will come back he has tried the OTC Gas-x which has helped some.   Also he would like a refill on his muscle relaxer also.  Laureen Ochs, Viann Shove, CMA

## 2019-03-26 LAB — ANA, IFA COMPREHENSIVE PANEL
Anti Nuclear Antibody (ANA): NEGATIVE
ENA SM Ab Ser-aCnc: <1 AI
SM/RNP: <1 AI
SSA (Ro) (ENA) Antibody, IgG: <1 AI
SSB (La) (ENA) Antibody, IgG: <1 AI
Scleroderma (Scl-70) (ENA) Antibody, IgG: <1 AI
ds DNA Ab: <1 IU/mL

## 2019-03-26 LAB — SEDIMENTATION RATE: Sed Rate: 2 mm/h (ref 0–20)

## 2019-03-26 LAB — RHEUMATOID ARTHRITIS DIAGNOSTIC PANEL, COMPREHENSIVE
Cyclic Citrullin Peptide Ab: <16 Units (ref <20)
Rheumatoid Factor (IgA): <5 U (ref <=6)
Rheumatoid Factor (IgG): <5 U (ref <=6)
Rheumatoid Factor (IgM): 5 U (ref <=6)
SSA (Ro) (ENA) Antibody, IgG: <1 AI
SSB (La) (ENA) Antibody, IgG: <1 AI

## 2019-03-26 LAB — URIC ACID: Uric Acid, Serum: 7.5 mg/dL (ref 4.0–8.0)

## 2019-03-26 LAB — CK: Total CK: 336 U/L — ABNORMAL HIGH (ref 44–196)

## 2019-04-01 ENCOUNTER — Encounter: Payer: Self-pay | Admitting: Sports Medicine

## 2019-04-01 ENCOUNTER — Telehealth: Payer: Self-pay | Admitting: Sports Medicine

## 2019-04-01 ENCOUNTER — Ambulatory Visit (INDEPENDENT_AMBULATORY_CARE_PROVIDER_SITE_OTHER): Payer: BC Managed Care – PPO | Admitting: Sports Medicine

## 2019-04-01 DIAGNOSIS — L603 Nail dystrophy: Secondary | ICD-10-CM

## 2019-04-01 MED ORDER — HYDROCODONE-ACETAMINOPHEN 5-325 MG PO TABS: 1.0000 | ORAL_TABLET | Freq: Three times a day (TID) | ORAL | 0 refills | Status: AC | PRN

## 2019-04-01 NOTE — Telephone Encounter (Signed)
Patient called and is having issues with his left toe and wants to talk with you and see if Dr. Karie Schwalbe will evaluate him in the office. I have left a message for him to call back. He did not specify what was wrong and I asked him to call back and let us know what was going on with his left toe.

## 2019-04-01 NOTE — Telephone Encounter (Signed)
Patient had follow up in office today.

## 2019-04-01 NOTE — Progress Notes (Signed)
Subjective:    CC: Recheck toe  HPI: Steve Maxwell returns, he is a pleasant 63 year old male with onychodystrophy, we have treated him aggressively with oral Lamisil for 6 months now.  Unfortunately his symptoms are persistent, localized in the right toenail without radiation, moderate pain, localized.  He does have a history of a right toenail removal performed by another provider.  Symptoms are moderate, persistent.  I reviewed the past medical history, family history, social history, surgical history, and allergies today and no changes were needed.  Please see the problem list section below in epic for further details.  Past Medical History: Past Medical History:  Diagnosis Date  . 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 HYPERTROPHY PROSTATE W/O UR OBST & OTH LUTS 01/11/2010   Qualifier: Diagnosis of  By: Linford Arnold MD, Santina Evans     Past Surgical History: Past Surgical History:  Procedure Laterality Date  . ANKLE SURGERY     Right   . BACK SURGERY  2008  . CARPAL TUNNEL RELEASE     right  . ELBOW SURGERY  2-09   Left and right  . KNEE SURGERY     right x 2  . KNEE SURGERY     Left   . PLANTAR FASCIA RELEASE     Right   . right rotator cuff surgery    . SHOULDER ARTHROSCOPY WITH ROTATOR CUFF REPAIR AND SUBACROMIAL DECOMPRESSION Right 12/22/2018   Dr. Everardo Pacific   Social History: Social History   Socioeconomic History  . Marital status: Divorced    Spouse name: Not on file  . Number of children: 2  . Years of education: Not on file  . Highest education level: Not on file  Occupational History  . Occupation: IT    Comment: Lexicographer  Social Needs  . Financial resource strain: Not on file  . Food insecurity:    Worry: Not on file    Inability: Not on file  . Transportation needs:    Medical: Not on file    Non-medical: Not on file  Tobacco Use  . Smoking status: Never Smoker  . Smokeless tobacco: Never Used  Substance and Sexual Activity  .  Alcohol use: No  . Drug use: No  . Sexual activity: Not on file  Lifestyle  . Physical activity:    Days per week: Not on file    Minutes per session: Not on file  . Stress: Not on file  Relationships  . Social connections:    Talks on phone: Not on file    Gets together: Not on file    Attends religious service: Not on file    Active member of club or organization: Not on file    Attends meetings of clubs or organizations: Not on file    Relationship status: Not on file  Other Topics Concern  . Not on file  Social History Narrative   Working out every day of  The week.    Family History: Family History  Problem Relation Age of Onset  . Alcohol abuse Mother   . Diabetes Father   . Stroke Father   . Thyroid disease Father   . Other Father        back problems  . Kidney disease Father        Renal failure  . Cancer Other    Allergies: Allergies  Allergen Reactions  . Avodart [Dutasteride] Other (See Comments)    Sexual dysfunction  . Viagra [Sildenafil Citrate]  Nausea Only    Headaches   Medications: See med rec.  Review of Systems: No fevers, chills, night sweats, weight loss, chest pain, or shortness of breath.   Objective:    General: Well Developed, well nourished, and in no acute distress.  Neuro: Alert and oriented x3, extra-ocular muscles intact, sensation grossly intact.  HEENT: Normocephalic, atraumatic, pupils equal round reactive to light, neck supple, no masses, no lymphadenopathy, thyroid nonpalpable.  Skin: Warm and dry, no rashes. Cardiac: Regular rate and rhythm, no murmurs rubs or gallops, no lower extremity edema.  Respiratory: Clear to auscultation bilaterally. Not using accessory muscles, speaking in full sentences. Left foot: Severe onychodystrophy, yellowing, thickening.  Procedure:  Removal of left great toenail. Risks, benefits, alternatives explained to patient. Consent obtained. Time out conducted. Noted no overlying induration or  erythema at site of injection. Toe cleaned with alcohol, then a total of 10cc lidocaine 2% infiltrated at adjacent webspaces at the location of the bifurcation of the common digital nerve to proper digital nerves.  Adequate anesthesia ensured. Toe prepped and draped in a sterile fashion. Nail elevator used to separate nail plate from nail bed. Hemostat then used to separate nail fragment from surrounding structures. Nail bed and matrix treated. Minor bleeding controlled with pressure and phenol. Antibiotic ointment applied. Toe dressed. Advised to return if increased redness, swelling, drainage, fevers, or chills.  Impression and Recommendations:    Onychodystrophy At this point has failed greater than 6 months of oral Lamisil. Today we performed a nail plate removal with phenol matricectomy. Return in 2 weeks for wound check.   ___________________________________________ Ihor Austinhomas J. Benjamin Stainhekkekandam, M.D., ABFM., CAQSM. Primary Care and Sports Medicine [facility]  Adjunct Professor of Family Medicine  [employer]

## 2019-04-01 NOTE — Assessment & Plan Note (Signed)
At this point has failed greater than 6 months of oral Lamisil. Today we performed a nail plate removal with phenol matricectomy. Return in 2 weeks for wound check.

## 2019-04-12 ENCOUNTER — Ambulatory Visit (INDEPENDENT_AMBULATORY_CARE_PROVIDER_SITE_OTHER): Payer: BC Managed Care – PPO | Admitting: Family Medicine

## 2019-04-12 ENCOUNTER — Telehealth: Payer: Self-pay | Admitting: General Practice

## 2019-04-12 DIAGNOSIS — Z20822 Contact with and (suspected) exposure to covid-19: Secondary | ICD-10-CM

## 2019-04-12 DIAGNOSIS — R6889 Other general symptoms and signs: Secondary | ICD-10-CM

## 2019-04-12 DIAGNOSIS — B338 Other specified viral diseases: Secondary | ICD-10-CM | POA: Diagnosis not present

## 2019-04-12 DIAGNOSIS — R197 Diarrhea, unspecified: Secondary | ICD-10-CM

## 2019-04-12 DIAGNOSIS — R5383 Other fatigue: Secondary | ICD-10-CM | POA: Diagnosis not present

## 2019-04-12 NOTE — Telephone Encounter (Signed)
Pt has been scheduled for Covid-19 testing. Scheduled apt with pt directly. Pt asked to be scheduled tomorrow.   Pt was referred by: Gregor Hams, MD

## 2019-04-12 NOTE — Progress Notes (Signed)
Virtual Visit  via Video Note  I connected with      Steve MassedLonnie R Meester by a video enabled telemedicine application and verified that I am speaking with the correct person using two identifiers.   I discussed the limitations of evaluation and management by telemedicine and the availability of in person appointments. The patient expressed understanding and agreed to proceed.  History of Present Illness: Steve Maxwell is a 63 y.o. male who would like to discuss fatigue.   Thursday or Friday of last week (June 4 and 5) Tao developed fatigue. Over the weekend developed sweating and diarrhea. No trouble breathing or cough. No sick contacts. Not tried anything yet. No CP or palpitations.  No abdominal pain or vomiting.  No chest pain palpitations or severe shortness of breath.  He denies any sick contacts.  Observations/Objective:  Exam: Appearance nontoxic no acute distress Normal Speech.    Lab and Radiology Results No results found for this or any previous visit (from the past 72 hour(s)). No results found.   Assessment and Plan: 63 y.o. male with fatigue associated with sweating and some diarrhea.  Unclear etiology.  Patient certainly will benefit from COVID-19 outpatient PCR testing.  Will proceed with testing and if negative and still symptomatic patient return to clinic for further evaluation.  We will do lab testing and imaging if needed.  Precautions reviewed.  If worsening neck step would be ED.  Recheck as needed.  PDMP not reviewed this encounter. No orders of the defined types were placed in this encounter.  No orders of the defined types were placed in this encounter.   Follow Up Instructions:    I discussed the assessment and treatment plan with the patient. The patient was provided an opportunity to ask questions and all were answered. The patient agreed with the plan and demonstrated an understanding of the instructions.   The patient was advised to call back  or seek an in-person evaluation if the symptoms worsen or if the condition fails to improve as anticipated.  Time: 15 minutes of intraservice time, with >22 minutes of total time during today's visit.      Historical information moved to improve visibility of documentation.  Past Medical History:  Diagnosis Date  . 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 HYPERTROPHY PROSTATE W/O UR OBST & OTH LUTS 01/11/2010   Qualifier: Diagnosis of  By: Linford ArnoldMetheney MD, Santina Evansatherine     Past Surgical History:  Procedure Laterality Date  . ANKLE SURGERY     Right   . BACK SURGERY  2008  . CARPAL TUNNEL RELEASE     right  . ELBOW SURGERY  2-09   Left and right  . KNEE SURGERY     right x 2  . KNEE SURGERY     Left   . PLANTAR FASCIA RELEASE     Right   . right rotator cuff surgery    . SHOULDER ARTHROSCOPY WITH ROTATOR CUFF REPAIR AND SUBACROMIAL DECOMPRESSION Right 12/22/2018   Dr. Everardo PacificVarkey   Social History   Tobacco Use  . Smoking status: Never Smoker  . Smokeless tobacco: Never Used  Substance Use Topics  . Alcohol use: No   family history includes Alcohol abuse in his mother; Cancer in an other family member; Diabetes in his father; Kidney disease in his father; Other in his father; Stroke in his father; Thyroid disease in his father.  Medications: Current Outpatient Medications  Medication Sig Dispense Refill  .  diclofenac (VOLTAREN) 75 MG EC tablet Take 1 tablet (75 mg total) by mouth 2 (two) times daily. 60 tablet 3  . diclofenac sodium (VOLTAREN) 1 % GEL Apply 4 g topically 4 (four) times daily. On hand joints 400 g 5  . HYDROcodone-acetaminophen (NORCO/VICODIN) 5-325 MG tablet Take 1 tablet by mouth every 8 (eight) hours as needed for moderate pain. 15 tablet 0  . Multiple Vitamins-Minerals (MENS MULTI VITAMIN & MINERAL PO) Take 1 tablet by mouth.    . Omega-3 Fatty Acids (FISH OIL) 1200 MG CAPS Take 1 capsule by mouth daily.    . pantoprazole (PROTONIX) 40 MG tablet Take 1  tablet (40 mg total) by mouth daily. 30 tablet 1  . tadalafil (CIALIS) 5 MG tablet Take one (5 mg) tablet daily for BPH 90 tablet 0  . valACYclovir (VALTREX) 1000 MG tablet TAKE TWO TABLETS BY MOUTH TWICE DAILY 6 tablet 6   No current facility-administered medications for this visit.    Allergies  Allergen Reactions  . Avodart [Dutasteride] Other (See Comments)    Sexual dysfunction  . Viagra [Sildenafil Citrate] Nausea Only    Headaches

## 2019-04-12 NOTE — Telephone Encounter (Signed)
-----   Message from Gregor Hams, MD sent at 04/12/2019  1:02 PM EDT ----- Regarding: Needs COVID test Please contact and arrange for outpaitent COVID 19 test

## 2019-04-12 NOTE — Addendum Note (Signed)
Addended by: Dimple Nanas on: 04/12/2019 01:30 PM   Modules accepted: Orders

## 2019-04-13 ENCOUNTER — Other Ambulatory Visit: Payer: BC Managed Care – PPO

## 2019-04-13 DIAGNOSIS — Z20822 Contact with and (suspected) exposure to covid-19: Secondary | ICD-10-CM

## 2019-04-15 ENCOUNTER — Ambulatory Visit: Payer: BC Managed Care – PPO | Admitting: Sports Medicine

## 2019-04-16 ENCOUNTER — Other Ambulatory Visit: Payer: Self-pay | Admitting: Family Medicine

## 2019-04-19 ENCOUNTER — Telehealth: Payer: Self-pay | Admitting: Family Medicine

## 2019-04-19 ENCOUNTER — Ambulatory Visit: Payer: BC Managed Care – PPO | Admitting: Sports Medicine

## 2019-04-19 ENCOUNTER — Encounter: Payer: Self-pay | Admitting: Family Medicine

## 2019-04-19 ENCOUNTER — Ambulatory Visit: Payer: BC Managed Care – PPO | Admitting: Family Medicine

## 2019-04-19 DIAGNOSIS — U071 COVID-19: Secondary | ICD-10-CM

## 2019-04-19 NOTE — Progress Notes (Signed)
I called patient again right around 445.  He was so short of breath that he could barely put 2 words together without gasping.  He said he could barely get to the bathroom today.  I asked if he had anyone that could take him to the emergency room and he said no so asked him if it was okay if I called 911.  I told him that I felt like he needed emergency care promptly.  I called 911 and they sent someone to his address.  I let them know that he is COVID positive.

## 2019-04-19 NOTE — Progress Notes (Signed)
lvm advising pt that I was trying to reach him prior to his virtual visit to obtain his VS and go over his medications.Audelia Hives Cable, CMA  Called pt [phone]) lvm advising that we were attempting to call regarding his appt today @ 1130. Maryruth Eve, Lahoma Crocker, CMA

## 2019-04-19 NOTE — Telephone Encounter (Signed)
Please advise patient his COVID-19 test was positive.

## 2019-04-19 NOTE — Telephone Encounter (Signed)
Left a message advising of the positive test and he would need to reschedule his appointment for today. He needs to self quarantine.

## 2019-04-20 ENCOUNTER — Encounter: Payer: Self-pay | Admitting: Family Medicine

## 2019-04-20 DIAGNOSIS — N183 Chronic kidney disease, stage 3 unspecified: Secondary | ICD-10-CM | POA: Insufficient documentation

## 2019-04-20 MED ORDER — CELLULOSE SODIUM PHOSPHATE VI
7500.00 | Status: DC
Start: 2019-04-22 — End: 2019-04-20

## 2019-04-20 MED ORDER — Medication
1.00 | Status: DC
Start: 2019-04-21 — End: 2019-04-20

## 2019-04-20 MED ORDER — Medication
Status: DC
Start: ? — End: 2019-04-20

## 2019-04-20 MED ORDER — GENERIC EXTERNAL MEDICATION
Status: DC
Start: ? — End: 2019-04-20

## 2019-04-20 MED ORDER — ARMOUR THYROID 30 MG PO TABS
2.00 | ORAL_TABLET | ORAL | Status: DC
Start: ? — End: 2019-04-20

## 2019-04-20 MED ORDER — SODIUM CHLORIDE 0.9 % IV SOLN
10.00 | INTRAVENOUS | Status: DC
Start: ? — End: 2019-04-20

## 2019-04-20 MED ORDER — ENOXAPARIN SODIUM 100 MG/ML ~~LOC~~ SOLN
1.00 | SUBCUTANEOUS | Status: DC
Start: 2019-04-20 — End: 2019-04-20

## 2019-04-20 MED ORDER — BARO-CAT PO
30.00 | ORAL | Status: DC
Start: 2019-04-22 — End: 2019-04-20

## 2019-04-20 MED ORDER — GLYCERIN 50 % PO SOLN
ORAL | Status: DC
Start: 2019-04-22 — End: 2019-04-20

## 2019-04-20 MED ORDER — PHENYLEPHRINE-GUAIFENESIN 20-375 MG PO CP12
10.00 | ORAL_CAPSULE | ORAL | Status: DC
Start: ? — End: 2019-04-20

## 2019-04-20 MED ORDER — GENERIC EXTERNAL MEDICATION
500.00 | Status: DC
Start: 2019-04-21 — End: 2019-04-20

## 2019-04-20 MED ORDER — ACETAMINOPHEN 325 MG PO TABS
650.00 | ORAL_TABLET | ORAL | Status: DC
Start: ? — End: 2019-04-20

## 2019-04-21 LAB — NOVEL CORONAVIRUS, NAA: SARS-CoV-2, NAA: DETECTED — AB

## 2019-04-21 MED ORDER — Medication
100.00 | Status: DC
Start: 2019-04-22 — End: 2019-04-21

## 2019-04-21 MED ORDER — Medication
Status: DC
Start: ? — End: 2019-04-21

## 2019-04-21 MED ORDER — PNEUMOCOCCAL 13-VAL CONJ VACC IM SUSP
0.50 | INTRAMUSCULAR | Status: DC
Start: ? — End: 2019-04-21

## 2019-04-22 MED ORDER — TISIT EX
0.10 | CUTANEOUS | Status: DC
Start: ? — End: 2019-04-22

## 2019-04-22 MED ORDER — Medication
Status: DC
Start: ? — End: 2019-04-22

## 2019-04-28 MED ORDER — Medication
0.08 | Status: DC
Start: ? — End: 2019-04-28

## 2019-04-28 MED ORDER — ACETAMINOPHEN 160 MG/5ML PO SUSP
650.00 | ORAL | Status: DC
Start: ? — End: 2019-04-28

## 2019-04-28 MED ORDER — INSULIN GLARGINE 100 UNIT/ML ~~LOC~~ SOLN
1.00 | SUBCUTANEOUS | Status: DC
Start: ? — End: 2019-04-28

## 2019-04-28 MED ORDER — Medication
Status: DC
Start: ? — End: 2019-04-28

## 2019-04-28 MED ORDER — COMPOUND W FREEZE OFF EX AERO
1.00 | INHALATION_SPRAY | CUTANEOUS | Status: DC
Start: ? — End: 2019-04-28

## 2019-04-28 MED ORDER — EQL GAUZE 4"X4" PADS
1.00 | MEDICATED_PAD | Status: DC
Start: ? — End: 2019-04-28

## 2019-04-28 MED ORDER — QUINERVA 260 MG PO TABS
650.00 | ORAL_TABLET | ORAL | Status: DC
Start: ? — End: 2019-04-28

## 2019-04-28 MED ORDER — GENERIC EXTERNAL MEDICATION
Status: DC
Start: ? — End: 2019-04-28

## 2019-04-28 MED ORDER — EQL HEAVY DUTY FABRIC STRIPS MISC
25.00 | Status: DC
Start: ? — End: 2019-04-28

## 2019-04-28 MED ORDER — BARO-CAT PO
75.00 | ORAL | Status: DC
Start: ? — End: 2019-04-28

## 2019-04-28 MED ORDER — NOREPINEPHRINE-SODIUM CHLORIDE 8-0.9 MG/250ML-% IV SOLN
0.10 | INTRAVENOUS | Status: DC
Start: ? — End: 2019-04-28

## 2019-04-28 MED ORDER — GENERIC EXTERNAL MEDICATION
0.04 | Status: DC
Start: ? — End: 2019-04-28

## 2019-04-28 MED ORDER — Medication
1.00 | Status: DC
Start: 2019-05-03 — End: 2019-04-28

## 2019-04-28 MED ORDER — EQL HEAVY DUTY FABRIC STRIPS MISC
100.00 | Status: DC
Start: ? — End: 2019-04-28

## 2019-04-28 MED ORDER — EQL HEAVY DUTY FABRIC STRIPS MISC
50.00 | Status: DC
Start: ? — End: 2019-04-28

## 2019-04-28 MED ORDER — INSULIN LISPRO 100 UNIT/ML ~~LOC~~ SOLN
1.00 | SUBCUTANEOUS | Status: DC
Start: 2019-04-30 — End: 2019-04-28

## 2019-04-28 MED ORDER — BISACODYL 10 MG RE SUPP
10.00 | RECTAL | Status: DC
Start: 2019-04-28 — End: 2019-04-28

## 2019-04-28 MED ORDER — TROPICAL LIQUID NUTRITION PO LIQD
5.00 | ORAL | Status: DC
Start: 2019-05-04 — End: 2019-04-28

## 2019-04-28 MED ORDER — BARO-CAT PO
10.00 | ORAL | Status: DC
Start: ? — End: 2019-04-28

## 2019-04-28 MED ORDER — GLUCO PERFECT 3 TEST VI STRP
1.00 | ORAL_STRIP | Status: DC
Start: ? — End: 2019-04-28

## 2019-04-28 MED ORDER — ACETAMINOPHEN 650 MG RE SUPP
650.00 | RECTAL | Status: DC
Start: ? — End: 2019-04-28

## 2019-04-28 MED ORDER — BISACODYL 10 MG RE SUPP
10.00 | RECTAL | Status: DC
Start: 2019-04-30 — End: 2019-04-28

## 2019-04-28 MED ORDER — DEXAMETHASONE SODIUM PHOSPHATE 4 MG/ML IJ SOLN
6.00 | INTRAMUSCULAR | Status: DC
Start: 2019-05-01 — End: 2019-04-28

## 2019-04-30 MED ORDER — GENERIC EXTERNAL MEDICATION
Status: DC
Start: ? — End: 2019-04-30

## 2019-04-30 MED ORDER — SODIUM CHLORIDE 0.9 % IV SOLN
3.00 | INTRAVENOUS | Status: DC
Start: ? — End: 2019-04-30

## 2019-04-30 MED ORDER — CARVEDILOL 6.25 MG PO TABS
6.25 | ORAL_TABLET | ORAL | Status: DC
Start: 2019-04-30 — End: 2019-04-30

## 2019-04-30 MED ORDER — AMLODIPINE BESYLATE 5 MG PO TABS
5.00 | ORAL_TABLET | ORAL | Status: DC
Start: 2019-05-01 — End: 2019-04-30

## 2019-04-30 MED ORDER — LABETALOL HCL 5 MG/ML IV SOLN
20.00 | INTRAVENOUS | Status: DC
Start: ? — End: 2019-04-30

## 2019-05-03 MED ORDER — COMPOUND W FREEZE OFF EX AERO
1.00 | INHALATION_SPRAY | CUTANEOUS | Status: DC
Start: 2019-05-03 — End: 2019-05-03

## 2019-05-03 MED ORDER — Medication
Status: DC
Start: ? — End: 2019-05-03

## 2019-05-03 MED ORDER — FOLTANX PO
1.00 | ORAL | Status: DC
Start: ? — End: 2019-05-03

## 2019-05-03 MED ORDER — STRI-DEX MAXIMUM STRENGTH 2 % EX PADS
50.00 | MEDICATED_PAD | CUTANEOUS | Status: DC
Start: ? — End: 2019-05-03

## 2019-05-03 MED ORDER — Medication
0.10 | Status: DC
Start: ? — End: 2019-05-03

## 2019-05-03 MED ORDER — GENERIC EXTERNAL MEDICATION
Status: DC
Start: ? — End: 2019-05-03

## 2019-05-03 MED ORDER — PYRETH-PIP BUTOX-PERMETH-NITRE CO KIT
0.10 | PACK | Status: DC
Start: ? — End: 2019-05-03

## 2019-05-03 MED ORDER — Medication
10.00 | Status: DC
Start: 2019-05-04 — End: 2019-05-03

## 2019-05-03 MED ORDER — ASSURE II CONTROL LEVEL 1 & 2 VI
12.50 | Status: DC
Start: 2019-05-03 — End: 2019-05-03

## 2019-05-12 MED ORDER — INSULIN GLARGINE 100 UNIT/ML ~~LOC~~ SOLN
1.00 | SUBCUTANEOUS | Status: DC
Start: 2019-05-12 — End: 2019-05-12

## 2019-05-12 MED ORDER — PROPOFOL 200 MG/20ML IV EMUL
1.00 | INTRAVENOUS | Status: DC
Start: ? — End: 2019-05-12

## 2019-05-12 MED ORDER — GLUCO PERFECT 3 TEST VI STRP
1.00 | ORAL_STRIP | Status: DC
Start: ? — End: 2019-05-12

## 2019-05-12 MED ORDER — GENERIC EXTERNAL MEDICATION
Status: DC
Start: ? — End: 2019-05-12

## 2019-05-12 MED ORDER — BISACODYL 10 MG RE SUPP
10.00 | RECTAL | Status: DC
Start: 2019-05-12 — End: 2019-05-12

## 2019-05-12 MED ORDER — ENOXAPARIN SODIUM 40 MG/0.4ML ~~LOC~~ SOLN
40.00 | SUBCUTANEOUS | Status: DC
Start: 2019-05-12 — End: 2019-05-12

## 2019-05-12 MED ORDER — GENERIC EXTERNAL MEDICATION
0.01 | Status: DC
Start: ? — End: 2019-05-12

## 2019-05-12 MED ORDER — CISATRACURIUM BESYLATE (PF) 10 MG/5ML IV SOLN
20.00 | INTRAVENOUS | Status: DC
Start: ? — End: 2019-05-12

## 2019-05-12 MED ORDER — CARBOXYMETHYLCELLULOSE SODIUM 1 % OP GEL
1.00 | OPHTHALMIC | Status: DC
Start: ? — End: 2019-05-12

## 2019-05-12 MED ORDER — INSULIN LISPRO 100 UNIT/ML ~~LOC~~ SOLN
1.00 | SUBCUTANEOUS | Status: DC
Start: 2019-05-12 — End: 2019-05-12

## 2019-05-12 MED ORDER — NOREPINEPHRINE-SODIUM CHLORIDE 8-0.9 MG/250ML-% IV SOLN
0.10 | INTRAVENOUS | Status: DC
Start: ? — End: 2019-05-12

## 2019-05-12 MED ORDER — GENERIC EXTERNAL MEDICATION
3.38 | Status: DC
Start: 2019-05-12 — End: 2019-05-12

## 2019-05-17 ENCOUNTER — Telehealth: Payer: Self-pay

## 2019-05-17 NOTE — Telephone Encounter (Signed)
Steve Maxwell called to let us know that Steve Maxwell passed away.

## 2019-06-05 DEATH — deceased

## 2019-06-14 ENCOUNTER — Other Ambulatory Visit: Payer: Self-pay | Admitting: Family Medicine
